# Patient Record
Sex: Female | Born: 1980 | Hispanic: Yes | Marital: Single | Smoking: Current some day smoker
Health system: Southern US, Community
[De-identification: ages and names within clinical notes are randomized; demographics above are authoritative.]

## PROBLEM LIST (undated history)

## (undated) ENCOUNTER — Inpatient Hospital Stay (HOSPITAL_COMMUNITY): Payer: Self-pay

## (undated) DIAGNOSIS — R51 Headache: Secondary | ICD-10-CM

## (undated) DIAGNOSIS — K219 Gastro-esophageal reflux disease without esophagitis: Secondary | ICD-10-CM

## (undated) DIAGNOSIS — L0390 Cellulitis, unspecified: Secondary | ICD-10-CM

## (undated) DIAGNOSIS — R519 Headache, unspecified: Secondary | ICD-10-CM

## (undated) HISTORY — PX: CHOLECYSTECTOMY: SHX55

---

## 2002-05-07 ENCOUNTER — Emergency Department (HOSPITAL_COMMUNITY): Admission: EM | Admit: 2002-05-07 | Discharge: 2002-05-07 | Payer: Self-pay | Admitting: Emergency Medicine

## 2002-05-07 ENCOUNTER — Encounter: Payer: Self-pay | Admitting: Emergency Medicine

## 2002-11-14 ENCOUNTER — Encounter (INDEPENDENT_AMBULATORY_CARE_PROVIDER_SITE_OTHER): Payer: Self-pay | Admitting: *Deleted

## 2002-11-14 ENCOUNTER — Encounter: Payer: Self-pay | Admitting: Obstetrics and Gynecology

## 2002-11-14 ENCOUNTER — Inpatient Hospital Stay (HOSPITAL_COMMUNITY): Admission: AD | Admit: 2002-11-14 | Discharge: 2002-11-14 | Payer: Self-pay | Admitting: Obstetrics and Gynecology

## 2004-03-16 ENCOUNTER — Inpatient Hospital Stay (HOSPITAL_COMMUNITY): Admission: AD | Admit: 2004-03-16 | Discharge: 2004-03-16 | Payer: Self-pay | Admitting: Obstetrics & Gynecology

## 2004-04-05 ENCOUNTER — Emergency Department (HOSPITAL_COMMUNITY): Admission: EM | Admit: 2004-04-05 | Discharge: 2004-04-05 | Payer: Self-pay | Admitting: Emergency Medicine

## 2004-05-15 ENCOUNTER — Inpatient Hospital Stay (HOSPITAL_COMMUNITY): Admission: AD | Admit: 2004-05-15 | Discharge: 2004-05-15 | Payer: Self-pay | Admitting: Obstetrics & Gynecology

## 2004-07-12 ENCOUNTER — Inpatient Hospital Stay (HOSPITAL_COMMUNITY): Admission: AD | Admit: 2004-07-12 | Discharge: 2004-07-12 | Payer: Self-pay | Admitting: Obstetrics

## 2004-08-14 ENCOUNTER — Inpatient Hospital Stay (HOSPITAL_COMMUNITY): Admission: AD | Admit: 2004-08-14 | Discharge: 2004-08-16 | Payer: Self-pay | Admitting: Obstetrics & Gynecology

## 2004-08-25 ENCOUNTER — Inpatient Hospital Stay (HOSPITAL_COMMUNITY): Admission: AD | Admit: 2004-08-25 | Discharge: 2004-08-25 | Payer: Self-pay | Admitting: Obstetrics & Gynecology

## 2004-08-31 ENCOUNTER — Inpatient Hospital Stay (HOSPITAL_COMMUNITY): Admission: AD | Admit: 2004-08-31 | Discharge: 2004-08-31 | Payer: Self-pay | Admitting: Obstetrics

## 2004-09-02 ENCOUNTER — Inpatient Hospital Stay (HOSPITAL_COMMUNITY): Admission: AD | Admit: 2004-09-02 | Discharge: 2004-09-04 | Payer: Self-pay | Admitting: Obstetrics

## 2004-09-02 ENCOUNTER — Inpatient Hospital Stay (HOSPITAL_COMMUNITY): Admission: AD | Admit: 2004-09-02 | Discharge: 2004-09-02 | Payer: Self-pay | Admitting: Obstetrics

## 2004-11-14 ENCOUNTER — Ambulatory Visit: Payer: Self-pay | Admitting: Gastroenterology

## 2004-12-27 ENCOUNTER — Encounter (INDEPENDENT_AMBULATORY_CARE_PROVIDER_SITE_OTHER): Payer: Self-pay | Admitting: Specialist

## 2004-12-27 ENCOUNTER — Ambulatory Visit (HOSPITAL_COMMUNITY): Admission: RE | Admit: 2004-12-27 | Discharge: 2004-12-28 | Payer: Self-pay | Admitting: General Surgery

## 2005-04-10 IMAGING — US US OB LIMITED
1 series · 14 of 28 positions shown · non-contrast
Comparison: none

CLINICAL DATA: Assess amniotic fluid volume.

[Series 1: us ob limited · 14 of 31 slices shown]
[im 2/31]
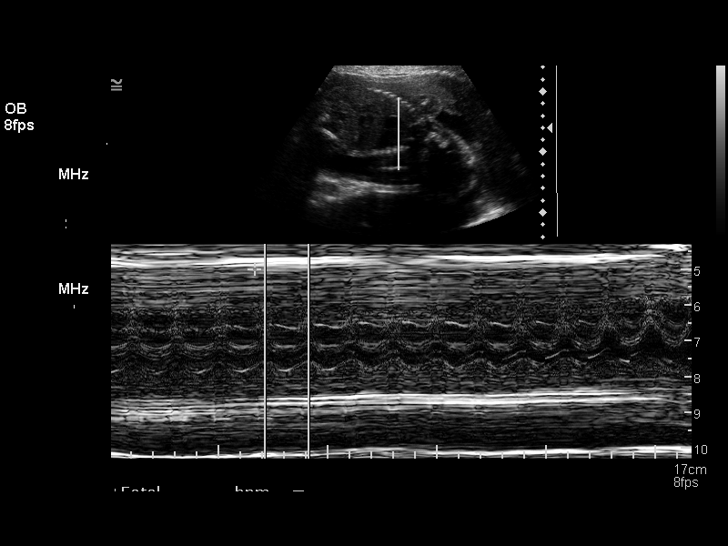
[im 4/31]
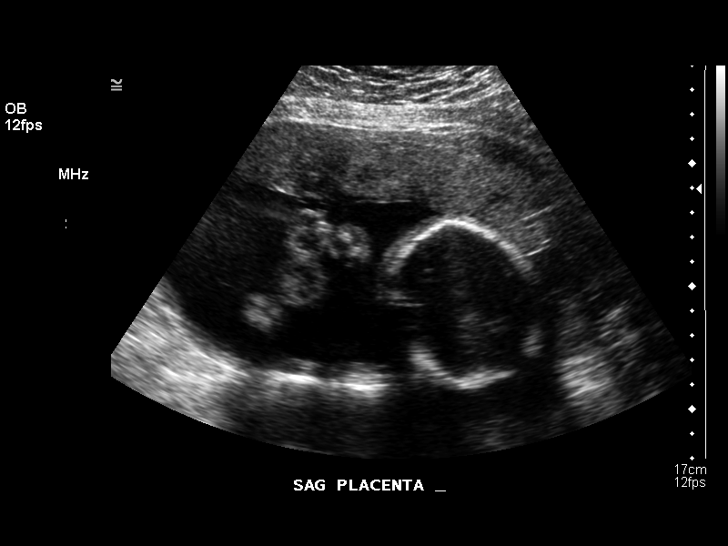
[im 6/31]
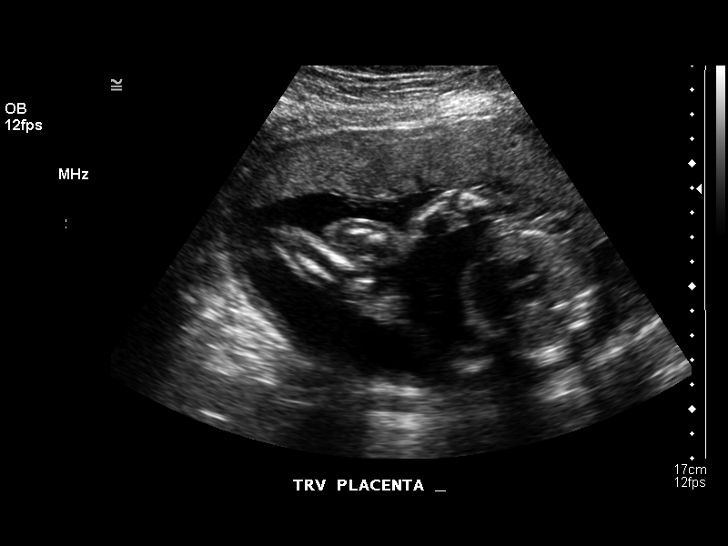
[im 8/31]
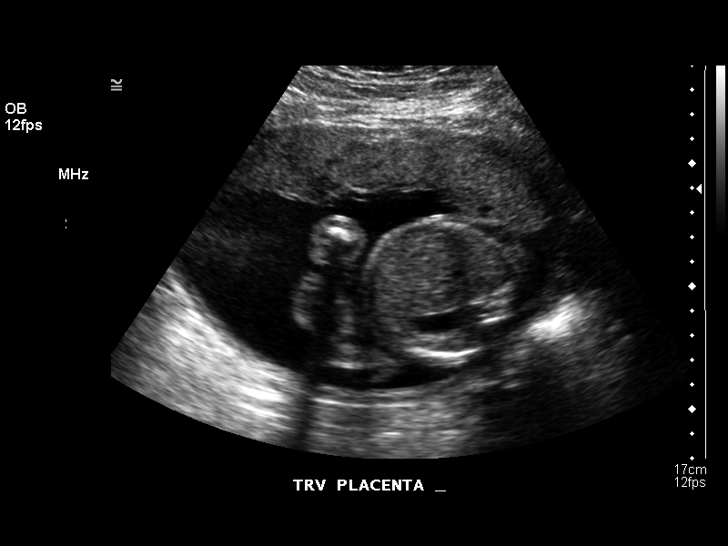
[im 11/31]
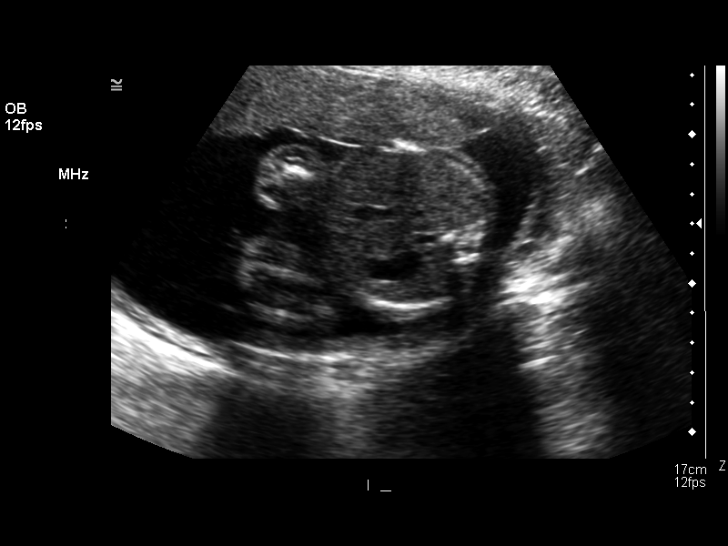
[im 13/31]
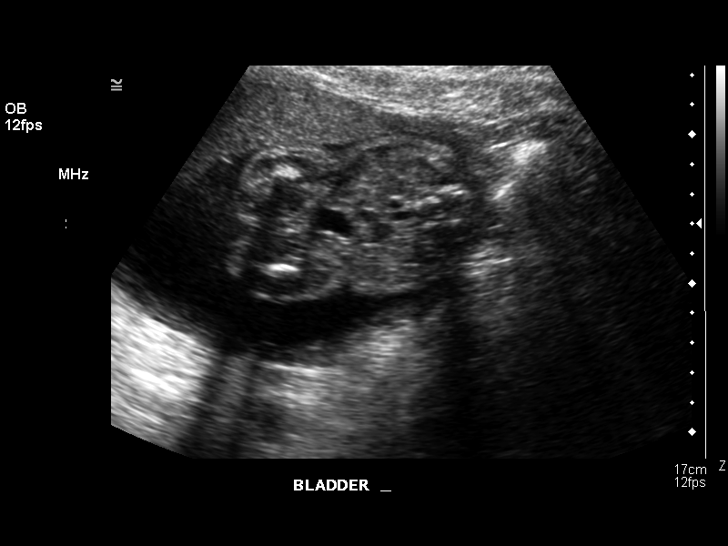
[im 15/31]
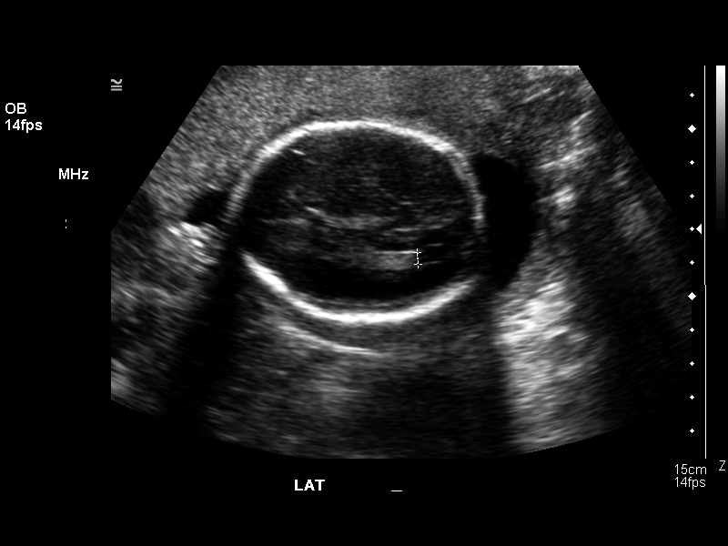
[im 17/31]
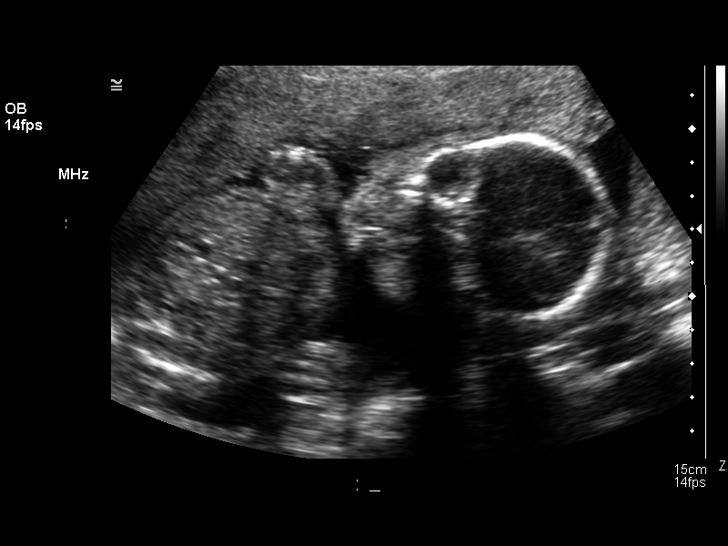
[im 19/31]
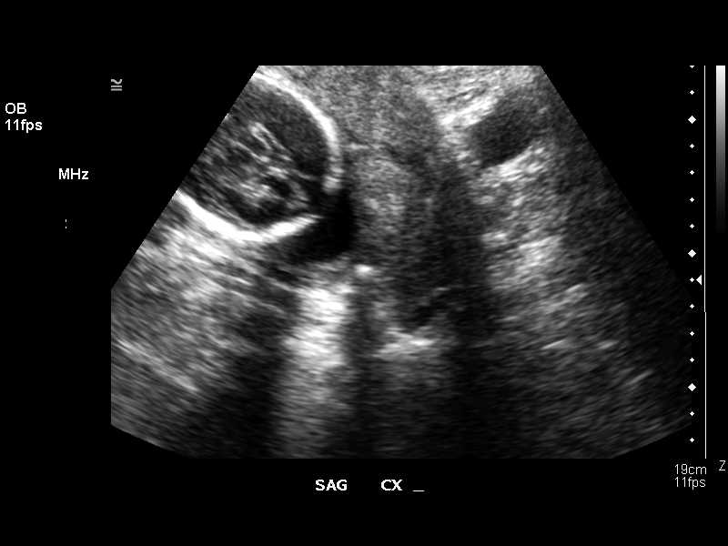
[im 22/31]
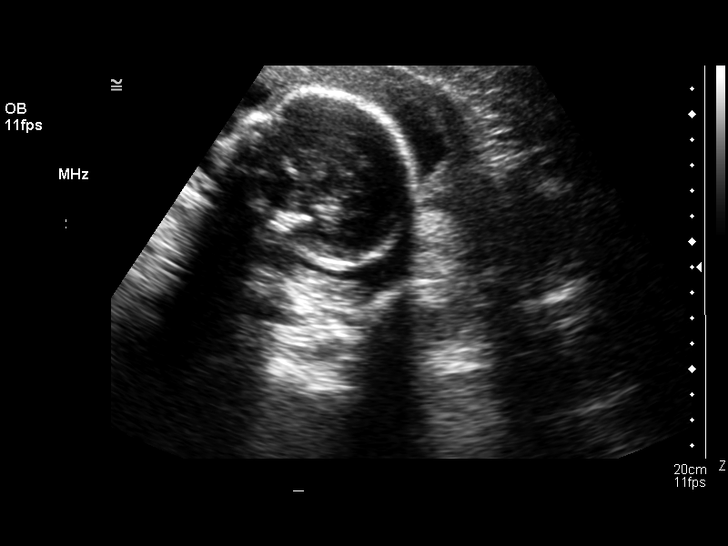
[im 24/31]
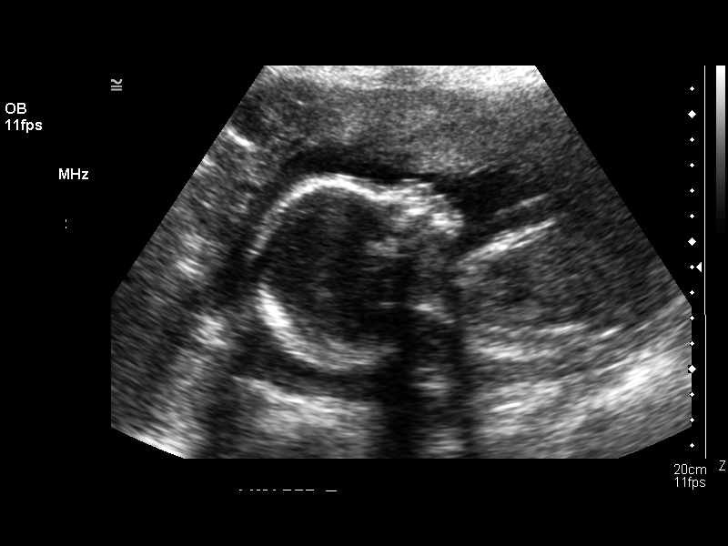
[im 26/31]
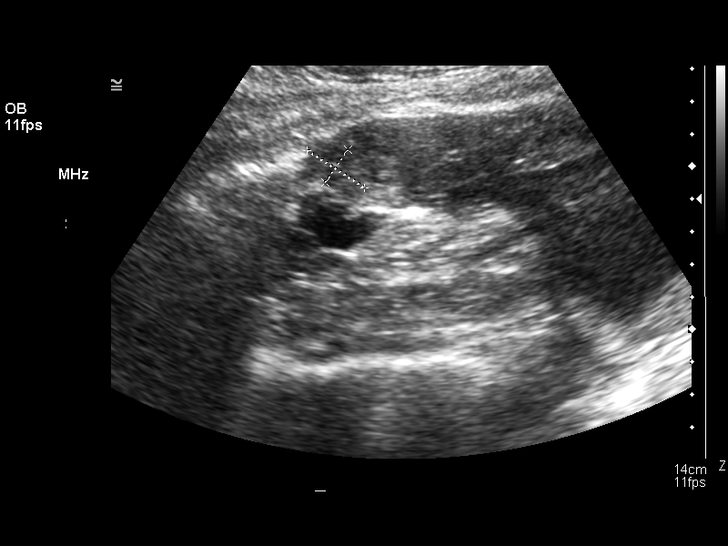
[im 28/31]
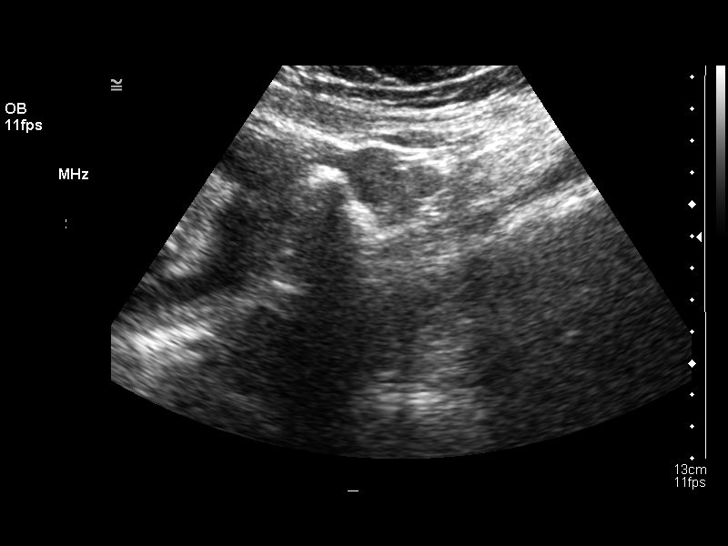
[im 31/31]
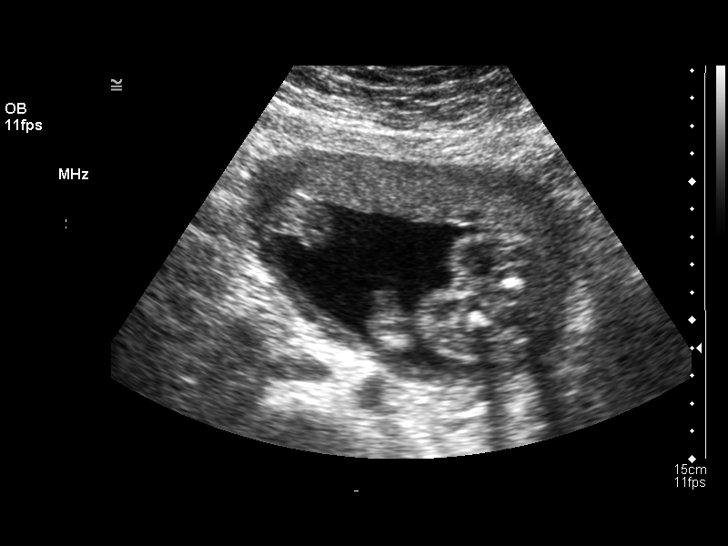

[14 of 28 positions shown; findings below may reference images not displayed]

LIMITED OBSTETRICAL ULTRASOUND:
Number of Fetuses: 1
Heart Rate:  150
Movement:  Yes
Breathing:  No
Presentation:  Cephalic
Placental Location:  Anterior
Grade:  I
Previa:  No
Amniotic Fluid (Subjective):  Normal
Amniotic Fluid (Objective):  5.4 cm Vertical pocket 

Fetal measurements and complete anatomic evaluation were not requested.  The following fetal anatomy was visualized during this exam:  Lateral ventricles, thalami, posterior fossa, stomach, 3-vessel cord, kidneys, bladder, profile, diaphragm and female genitalia.

MATERNAL UTERINE AND ADNEXAL FINDINGS
Cervix:  3.4 cm Transabdominally
IMPRESSION: 1.  Subjectively and quantitatively normal amniotic fluid volume and normal cervical length.
2.  A limited anatomic exam was performed as this requested as a limited study.  No late developing fetal anatomic abnormalities are identified associated with the lateral ventricles, bladder, kidneys or stomach.  A four chamber heart view could not be reassessed due to positioning on today?s exam.

## 2005-11-13 ENCOUNTER — Emergency Department (HOSPITAL_COMMUNITY): Admission: EM | Admit: 2005-11-13 | Discharge: 2005-11-13 | Payer: Self-pay | Admitting: Emergency Medicine

## 2006-05-11 ENCOUNTER — Emergency Department (HOSPITAL_COMMUNITY): Admission: EM | Admit: 2006-05-11 | Discharge: 2006-05-11 | Payer: Self-pay | Admitting: Emergency Medicine

## 2006-05-12 ENCOUNTER — Emergency Department (HOSPITAL_COMMUNITY): Admission: EM | Admit: 2006-05-12 | Discharge: 2006-05-12 | Payer: Self-pay | Admitting: Emergency Medicine

## 2006-05-14 ENCOUNTER — Emergency Department (HOSPITAL_COMMUNITY): Admission: EM | Admit: 2006-05-14 | Discharge: 2006-05-14 | Payer: Self-pay | Admitting: Emergency Medicine

## 2006-11-20 ENCOUNTER — Emergency Department (HOSPITAL_COMMUNITY): Admission: EM | Admit: 2006-11-20 | Discharge: 2006-11-20 | Payer: Self-pay | Admitting: Emergency Medicine

## 2007-01-23 ENCOUNTER — Inpatient Hospital Stay (HOSPITAL_COMMUNITY): Admission: AD | Admit: 2007-01-23 | Discharge: 2007-01-23 | Payer: Self-pay | Admitting: Obstetrics & Gynecology

## 2007-11-04 ENCOUNTER — Emergency Department (HOSPITAL_COMMUNITY): Admission: EM | Admit: 2007-11-04 | Discharge: 2007-11-04 | Payer: Self-pay | Admitting: Emergency Medicine

## 2007-12-29 ENCOUNTER — Emergency Department (HOSPITAL_COMMUNITY): Admission: EM | Admit: 2007-12-29 | Discharge: 2007-12-29 | Payer: Self-pay | Admitting: Emergency Medicine

## 2008-04-25 ENCOUNTER — Inpatient Hospital Stay (HOSPITAL_COMMUNITY): Admission: AD | Admit: 2008-04-25 | Discharge: 2008-04-25 | Payer: Self-pay | Admitting: Obstetrics & Gynecology

## 2009-01-23 ENCOUNTER — Emergency Department (HOSPITAL_COMMUNITY): Admission: EM | Admit: 2009-01-23 | Discharge: 2009-01-24 | Payer: Self-pay | Admitting: Emergency Medicine

## 2010-04-06 ENCOUNTER — Emergency Department (HOSPITAL_COMMUNITY)
Admission: EM | Admit: 2010-04-06 | Discharge: 2010-04-06 | Payer: Self-pay | Source: Home / Self Care | Admitting: Emergency Medicine

## 2010-04-08 ENCOUNTER — Emergency Department (HOSPITAL_COMMUNITY)
Admission: EM | Admit: 2010-04-08 | Discharge: 2010-04-08 | Payer: Self-pay | Source: Home / Self Care | Admitting: Emergency Medicine

## 2010-04-29 ENCOUNTER — Encounter: Payer: Self-pay | Admitting: Obstetrics and Gynecology

## 2010-06-19 LAB — URINE MICROSCOPIC-ADD ON

## 2010-06-19 LAB — BASIC METABOLIC PANEL
Calcium: 8.9 mg/dL (ref 8.4–10.5)
GFR calc Af Amer: >60 mL/min (ref >60)
GFR calc non Af Amer: >60 mL/min (ref >60)
Potassium: 3.4 mEq/L — ABNORMAL LOW (ref 3.5–5.1)
Sodium: 140 mEq/L (ref 135–145)

## 2010-06-19 LAB — GC/CHLAMYDIA PROBE AMP, GENITAL
Chlamydia, DNA Probe: NEGATIVE
GC Probe Amp, Genital: NEGATIVE

## 2010-06-19 LAB — WET PREP, GENITAL
Trich, Wet Prep: NONE SEEN
Yeast Wet Prep HPF POC: NONE SEEN

## 2010-06-19 LAB — CBC
Hemoglobin: 13.3 g/dL (ref 12.0–15.0)
MCH: 30.2 pg (ref 26.0–34.0)
MCHC: 33.7 g/dL (ref 30.0–36.0)
RBC: 4.41 MIL/uL (ref 3.87–5.11)
WBC: 9.6 10*3/uL (ref 4.0–10.5)

## 2010-06-19 LAB — URINE CULTURE

## 2010-06-19 LAB — URINALYSIS, ROUTINE W REFLEX MICROSCOPIC
Nitrite: NEGATIVE
Specific Gravity, Urine: 1.028 (ref 1.005–1.030)
pH: 7.5 (ref 5.0–8.0)

## 2010-07-13 LAB — CBC
HCT: 40.8 % (ref 36.0–46.0)
Hemoglobin: 13.8 g/dL (ref 12.0–15.0)
MCHC: 33.9 g/dL (ref 30.0–36.0)
MCV: 88.4 fL (ref 78.0–100.0)
Platelets: 178 10*3/uL (ref 150–400)
RBC: 4.61 MIL/uL (ref 3.87–5.11)
WBC: 11.9 10*3/uL — ABNORMAL HIGH (ref 4.0–10.5)

## 2010-07-13 LAB — POCT I-STAT, CHEM 8
BUN: 10 mg/dL (ref 6–23)
Calcium, Ion: 1.18 mmol/L (ref 1.12–1.32)
Creatinine, Ser: 0.8 mg/dL (ref 0.4–1.2)
Glucose, Bld: 93 mg/dL (ref 70–99)
HCT: 44 % (ref 36.0–46.0)
TCO2: 27 mmol/L (ref 0–100)

## 2010-07-13 LAB — DIFFERENTIAL
Basophils Absolute: 0.1 10*3/uL (ref 0.0–0.1)
Eosinophils Relative: 5 % (ref 0–5)
Lymphocytes Relative: 44 % (ref 12–46)
Monocytes Relative: 4 % (ref 3–12)
Neutrophils Relative %: 47 % (ref 43–77)

## 2010-08-25 NOTE — Op Note (Signed)
Brandy Beasley, Brandy Beasley               ACCOUNT NO.:  [id number]   MEDICAL RECORD NO.:  [id number]          PATIENT TYPE:  AMB   LOCATION:  SDS                          FACILITY:  [facility]   PHYSICIAN:  Gita Kudo, M.D. DATE OF BIRTH:  1980/11/29   DATE OF PROCEDURE:  12/27/2004  DATE OF DISCHARGE:                                 OPERATIVE REPORT   OPERATIVE PROCEDURE:  Laparoscopic cholecystectomy.   SURGEON:  Gita Kudo, M.D.   ASSISTANT:  Lebron Conners, M.D.   ANESTHESIA:  General endotracheal.   PREOPERATIVE DIAGNOSIS:  Gallstones, normal liver function studies.   POSTOPERATIVE DIAGNOSIS:  Gallstones, normal liver function studies.   CLINICAL SUMMARY:  [age]-year-old [employer] student with abdominal pain,  gallbladder ultrasound showing stones, normal liver function studies and a  strong family history of gallbladder disease.  Brought in for elective  cholecystectomy.   OPERATIVE FINDINGS:  The gallbladder was thin-walled, had multiple tiny  stones in it.  The cystic duct was quite small and I could not cannulate it  for cholangiogram.  The anatomy was quite normal of both the cystic duct and  artery.   OPERATIVE PROCEDURE:  Under satisfactory general endotracheal anesthesia,  the patient's abdomen was prepped and draped in a standard fashion.  She  received 1 g Ancef preop and a total of 30 mL of 0.5% Marcaine with  epinephrine were infiltrated at the skin incision sites for postop  analgesia.  A vertical incision made below the umbilicus and carried down to  the midline, which was opened into the peritoneum.  Controlled with a figure-  of-eight 0 Vicryl suture and operating Hasson port inserted and secured.  Good CO2 pneumoperitoneum established and then two #5 ports placed laterally  and a second #10 port medially.  This was done after the camera was placed  under direct visualization.  The lateral ports gave excellent exposure with  graspers and then operating  through the medial port, I carefully dissected  the cystic duct-gallbladder junction and circumferentially dissected the  cystic duct and artery and when the structures were securely identified,  multiple clips placed on the cystic artery and a single clip on the duct  near the gallbladder.  Incision made in the duct and cholangiogram catheter  inserted percutaneously, but I was unable to get it into the duct, which was  quite small.  We were sure of the anatomy and therefore the catheter  withdrawn and the duct controlled with multiple clips distally and then both  it and the artery divided.  The gallbladder was then dissected from below  upward using coagulating current for dissection and hemostasis.  A small  posterior branch of the artery was identified and controlled with clips and  then cauterized.  In dissecting the gallbladder, a small hole was made in it  with a leakage of a little clear bile, but no stone.  The dissection was  continued up and the gallbladder amputated from the bed.  The bed was  checked for hemostasis by cautery, lavaged with saline and suctioned dry.  Then the gallbladder placed in  an EndoCatch bag and the camera moved to the  upper port and through the lower port, the gallbladder with the bag removed  without spillage or complication.  A few small stones were felt in the  gallbladder and some given to the patient for her collection.   Following this, the operative site was again checked, lavaged with saline  and suctioned dry.  Hemostasis was good, no spillage, the ports removed and  CO2 released.  Then the midline was closed with a previous figure-of-eight  and a second interrupted 0 Vicryl and the subcu approximated with 4-0 Vicryl  and skin edges with Steri-Strips.  Sterile absorbent dressings were then  applied and the patient went to the recovery room from the operating room  good condition.           ______________________________  Gita Kudo,  M.D.     MRL/MEDQ  D:  12/27/2004  T:  12/27/2004  Job:  [id number]   cc:   [name], M.D.   Vania Rea. Jarold Motto, M.D. [facility]  [address]  [place]  [place] [address]

## 2011-01-08 LAB — COMPREHENSIVE METABOLIC PANEL
AST: 27
Albumin: 3.8
BUN: 8
Chloride: 105
Glucose, Bld: 100 — ABNORMAL HIGH
Potassium: 4
Sodium: 134 — ABNORMAL LOW
Total Bilirubin: 1.2
Total Protein: 7.2

## 2011-01-08 LAB — URINE MICROSCOPIC-ADD ON

## 2011-01-08 LAB — DIFFERENTIAL
Eosinophils Absolute: 0.3
Eosinophils Relative: 3
Lymphocytes Relative: 31
Monocytes Relative: 6

## 2011-01-08 LAB — CBC
Hemoglobin: 14.6
MCV: 87.1
Platelets: 170
WBC: 9.8

## 2011-01-08 LAB — URINALYSIS, ROUTINE W REFLEX MICROSCOPIC
Bilirubin Urine: NEGATIVE
Protein, ur: NEGATIVE
Specific Gravity, Urine: 1.035 — ABNORMAL HIGH
Urobilinogen, UA: 1
pH: 6

## 2011-01-08 LAB — PREGNANCY, URINE: Preg Test, Ur: NEGATIVE

## 2011-01-17 LAB — URINALYSIS, ROUTINE W REFLEX MICROSCOPIC
Glucose, UA: NEGATIVE
pH: 6

## 2011-01-17 LAB — WET PREP, GENITAL
Clue Cells Wet Prep HPF POC: NONE SEEN
Trich, Wet Prep: NONE SEEN
Yeast Wet Prep HPF POC: NONE SEEN

## 2011-01-22 LAB — URINE CULTURE

## 2011-01-22 LAB — ABO/RH: ABO/RH(D): A POS

## 2011-01-22 LAB — DIFFERENTIAL
Basophils Relative: 1
Eosinophils Absolute: 0.2
Monocytes Relative: 6
Neutrophils Relative %: 51

## 2011-01-22 LAB — POCT PREGNANCY, URINE
Operator id: 29017
Preg Test, Ur: NEGATIVE

## 2011-01-22 LAB — URINALYSIS, ROUTINE W REFLEX MICROSCOPIC
Glucose, UA: NEGATIVE
Nitrite: NEGATIVE
Protein, ur: NEGATIVE
Urobilinogen, UA: 0.2

## 2011-01-22 LAB — CBC
MCHC: 34.1
MCV: 83.5
Platelets: 187

## 2011-01-22 LAB — WET PREP, GENITAL: Yeast Wet Prep HPF POC: NONE SEEN

## 2011-01-22 LAB — URINE MICROSCOPIC-ADD ON

## 2011-01-22 LAB — GC/CHLAMYDIA PROBE AMP, GENITAL: GC Probe Amp, Genital: NEGATIVE

## 2011-04-10 ENCOUNTER — Encounter: Payer: Self-pay | Admitting: *Deleted

## 2011-04-10 ENCOUNTER — Emergency Department (HOSPITAL_COMMUNITY)
Admission: EM | Admit: 2011-04-10 | Discharge: 2011-04-11 | Disposition: A | Discharge status: Home / Self Care | Payer: Medicaid Other | Attending: Emergency Medicine | Admitting: Emergency Medicine

## 2011-04-10 DIAGNOSIS — R059 Cough, unspecified: Secondary | ICD-10-CM | POA: Insufficient documentation

## 2011-04-10 DIAGNOSIS — R062 Wheezing: Secondary | ICD-10-CM | POA: Insufficient documentation

## 2011-04-10 DIAGNOSIS — J40 Bronchitis, not specified as acute or chronic: Secondary | ICD-10-CM | POA: Insufficient documentation

## 2011-04-10 DIAGNOSIS — F17200 Nicotine dependence, unspecified, uncomplicated: Secondary | ICD-10-CM | POA: Insufficient documentation

## 2011-04-10 DIAGNOSIS — R05 Cough: Secondary | ICD-10-CM | POA: Insufficient documentation

## 2011-04-10 MED ORDER — IPRATROPIUM BROMIDE 0.02 % IN SOLN
0.5000 mg | RESPIRATORY_TRACT | Status: AC
  Administered 2011-04-11: 0.5 mg via RESPIRATORY_TRACT
  Filled 2011-04-10: qty 2.5

## 2011-04-10 MED ORDER — ALBUTEROL SULFATE (5 MG/ML) 0.5% IN NEBU
5.0000 mg | INHALATION_SOLUTION | Freq: Once | RESPIRATORY_TRACT | Status: AC
  Administered 2011-04-11: 5 mg via RESPIRATORY_TRACT
  Filled 2011-04-10: qty 1

## 2011-04-10 NOTE — ED Notes (Signed)
Pt in c/o cough and congestion, headache and body aches, sore throat x3 weeks, worse last night

## 2011-04-11 ENCOUNTER — Emergency Department (HOSPITAL_COMMUNITY): Payer: Medicaid Other

## 2011-04-11 MED ORDER — LIDOCAINE-EPINEPHRINE (PF) 2 %-1:200000 IJ SOLN: 20.0000 mL | Freq: Once | INTRAMUSCULAR | Status: DC

## 2011-04-11 MED ORDER — AZITHROMYCIN 250 MG PO TABS: 250.0000 mg | ORAL_TABLET | Freq: Every day | ORAL | Status: AC

## 2011-04-11 MED ORDER — HYDROCOD POLST-CHLORPHEN POLST 10-8 MG/5ML PO LQCR: 5.0000 mL | Freq: Two times a day (BID) | ORAL | Status: DC | PRN

## 2011-04-11 MED ORDER — ALBUTEROL SULFATE HFA 108 (90 BASE) MCG/ACT IN AERS: 2.0000 | INHALATION_SPRAY | RESPIRATORY_TRACT | Status: DC | PRN

## 2011-04-11 NOTE — ED Provider Notes (Signed)
History     CSN: [id number]  Arrival date & time 04/10/11  2304   First MD Initiated Contact with Patient 04/10/11 2333      Chief Complaint  Patient presents with  . URI   HPI Patient presents to the emergency room with complaint of cough and congestion for three weeks. Reports that she smokes. Productive cough. No other concerns. Fevers two weeks ago. No nausea, vomiting, abdominal pain or chest pain.   History reviewed. No pertinent past medical history.  History reviewed. No pertinent past surgical history.  History reviewed. No pertinent family history.  History  Substance Use Topics  . Smoking status: Current Everyday Smoker  . Smokeless tobacco: Not on file  . Alcohol Use: No    OB History    Grav Para Term Preterm Abortions TAB SAB Ect Mult Living                  Review of Systems  Constitutional: Positive for fever. Negative for chills, diaphoresis and appetite change.  HENT: Positive for congestion. Negative for drooling, trouble swallowing, neck pain, neck stiffness and voice change.   Eyes: Negative for photophobia and visual disturbance.  Respiratory: Positive for cough. Negative for chest tightness, shortness of breath, wheezing and stridor.   Cardiovascular: Negative for chest pain.  Gastrointestinal: Negative for nausea, vomiting and abdominal pain.  Genitourinary: Negative for flank pain.  Musculoskeletal: Negative for back pain.  Skin: Negative for rash.  Neurological: Negative for weakness and numbness.  All other systems reviewed and are negative.    Allergies  Review of patient's allergies indicates no known allergies.  Home Medications   Current Outpatient Rx  Name Route Sig Dispense Refill  . GUAIFENESIN ER 600 MG PO TB12 Oral Take 1,200 mg by mouth 2 (two) times daily.      Marland Kitchen PSEUDOEPHEDRINE HCL ER 120 MG PO TB12 Oral Take 120 mg by mouth every 12 (twelve) hours.        BP 130/84  Pulse 112  Temp(Src) 98.4 F (36.9 C) (Oral)   Resp 20  SpO2 100%  Physical Exam  Nursing note and vitals reviewed. Constitutional: She is oriented to person, place, and time. She appears well-developed and well-nourished.  Non-toxic appearance. No distress.       Vital signs stable  HENT:  Head: Normocephalic and atraumatic.  Right Ear: External ear normal.  Left Ear: External ear normal.  Nose: Nose normal.  Mouth/Throat: Oropharynx is clear and moist. No oropharyngeal exudate.  Eyes: EOM are normal. Pupils are equal, round, and reactive to light. Right eye exhibits no discharge. Left eye exhibits no discharge. No scleral icterus.  Neck: Normal range of motion. Neck supple.  Cardiovascular: Normal rate, regular rhythm, normal heart sounds and intact distal pulses.  Exam reveals no gallop and no friction rub.   No murmur heard. Pulmonary/Chest: Effort normal and breath sounds normal. No respiratory distress. She has no wheezes. She exhibits no tenderness.  Abdominal: Soft. Bowel sounds are normal. There is no tenderness. There is no rebound and no guarding.  Musculoskeletal: Normal range of motion. She exhibits no edema and no tenderness.  Lymphadenopathy:    She has no cervical adenopathy.  Neurological: She is alert and oriented to person, place, and time. She exhibits normal muscle tone.  Skin: Skin is warm and dry. No rash noted. She is not diaphoretic.  Psychiatric: She has a normal mood and affect. Her behavior is normal. Judgment and thought content normal.  ED Course  Procedures (including critical care time)  Patient seen and evaluated.  VSS reviewed. . Nursing notes reviewed.  Initial testing ordered. Will monitor the patient closely. They agree with the treatment plan and diagnosis.    Labs Reviewed - No data to display Dg Chest 2 View  04/11/2011  *RADIOLOGY REPORT*  Clinical Data: Fever.  Cough.  Wheezing.  CHEST - 2 VIEW  Comparison: 11/13/2005  Findings: There is poorly defined nodularity in the lingula which  was not present on the prior exam from 2007.  Mild airway thickening suggest bronchitis or reactive airways disease.  Heart size is within normal limits.  No pleural effusion is observed.  IMPRESSION:  1.  Airway thickening suggesting bronchitis or reactive airways disease. 2.  Mild nodularity in the lingula which was not present in 2007. This could be from atelectasis or confluent inflammatory process in the lingula.  Follow-up chest radiography in 3-4 week's time may be warranted to ensure resolution and exclude the unlikely possibility of a neoplastic pulmonary nodule.  Original Report Authenticated By: Dellia Cloud, M.D.   Patient seen and re-evaluated. Resting comfortably. VSS stable. NAD. Patient notified of testing results. Stated agreement and understanding. Patient stated understanding to treatment plan and diagnosis.    No diagnosis found.    MDM  Bronchitis, VS improved, HR 103. 100% on RA. No chest pain. No inspiration pain. No SOB. Will treat with antibiotics as patient is a smoker. Prescribe inhaler as well. Notified patient of testing results and importance of repeat chest imaging in 3 weeks. Stated agreement and understanding.         Demetrius Charity, PA 04/11/11 [phone]

## 2011-04-11 NOTE — ED Provider Notes (Signed)
Medical screening examination/treatment/procedure(s) were performed by non-physician practitioner and as supervising physician I was immediately available for consultation/collaboration.   Suzi Roots, MD 04/11/11 [phone]

## 2012-05-06 ENCOUNTER — Emergency Department (HOSPITAL_COMMUNITY)
Admission: EM | Admit: 2012-05-06 | Discharge: 2012-05-06 | Disposition: A | Discharge status: Home / Self Care | Payer: Medicaid Other | Attending: Emergency Medicine | Admitting: Emergency Medicine

## 2012-05-06 ENCOUNTER — Emergency Department (HOSPITAL_COMMUNITY): Payer: Medicaid Other

## 2012-05-06 ENCOUNTER — Encounter (HOSPITAL_COMMUNITY): Payer: Self-pay | Admitting: Emergency Medicine

## 2012-05-06 DIAGNOSIS — IMO0002 Reserved for concepts with insufficient information to code with codable children: Secondary | ICD-10-CM | POA: Insufficient documentation

## 2012-05-06 DIAGNOSIS — F17200 Nicotine dependence, unspecified, uncomplicated: Secondary | ICD-10-CM | POA: Insufficient documentation

## 2012-05-06 DIAGNOSIS — Y92009 Unspecified place in unspecified non-institutional (private) residence as the place of occurrence of the external cause: Secondary | ICD-10-CM | POA: Insufficient documentation

## 2012-05-06 DIAGNOSIS — W208XXA Other cause of strike by thrown, projected or falling object, initial encounter: Secondary | ICD-10-CM | POA: Insufficient documentation

## 2012-05-06 DIAGNOSIS — T148XXA Other injury of unspecified body region, initial encounter: Secondary | ICD-10-CM | POA: Insufficient documentation

## 2012-05-06 DIAGNOSIS — Y9389 Activity, other specified: Secondary | ICD-10-CM | POA: Insufficient documentation

## 2012-05-06 DIAGNOSIS — M549 Dorsalgia, unspecified: Secondary | ICD-10-CM

## 2012-05-06 MED ORDER — CYCLOBENZAPRINE HCL 10 MG PO TABS: 10.0000 mg | ORAL_TABLET | Freq: Two times a day (BID) | ORAL | Status: DC | PRN

## 2012-05-06 MED ORDER — HYDROCODONE-ACETAMINOPHEN 5-325 MG PO TABS: 1.0000 | ORAL_TABLET | ORAL | Status: DC | PRN

## 2012-05-06 MED ORDER — OXYCODONE-ACETAMINOPHEN 5-325 MG PO TABS
2.0000 | ORAL_TABLET | Freq: Once | ORAL | Status: AC
  Administered 2012-05-06: 2 via ORAL
  Filled 2012-05-06: qty 2

## 2012-05-06 NOTE — ED Notes (Signed)
Pt states about a month ago she was putting clothes in her dresser and the TV fell off onto her back  Pt states since then she has been having pain from her midback down  Pt describes it as a bone on bone, grinding pain

## 2012-05-06 NOTE — ED Notes (Signed)
Pt has a ride home.  

## 2012-05-06 NOTE — ED Provider Notes (Signed)
Medical screening examination/treatment/procedure(s) were performed by non-physician practitioner and as supervising physician I was immediately available for consultation/collaboration.  Jeanie Mccard R. Jet Armbrust, MD 05/06/12 2242 

## 2012-05-06 NOTE — ED Provider Notes (Signed)
History     CSN: [id number]  Arrival date & time 05/06/12  2100   First MD Initiated Contact with Patient 05/06/12 2110      Chief Complaint  Patient presents with  . Back Pain    (Consider location/radiation/quality/duration/timing/severity/associated sxs/prior treatment) HPI Comments: 32 y/o female with no significant past medical history presents to the ED complaining of back pain x 1 month after a TV fell off her son's dresser onto her back. Since then she has back pain from middle of her back down to lower back. Pain described as "her bones grinding" rated 7/10. Yesterday the pain worsened to 10/10. She tried taking motrin without relief. Walking and moving make the pain worse. Admits to occasional numbness down both of her legs. No loss of control of bowels or bladder or saddle anesthesia. Denies nausea or vomiting. There was never any bruising present, however her friend states her back appeared "swollen" initially.   The history is provided by the patient.    History reviewed. No pertinent past medical history.  Past Surgical History  Procedure Date  . Cholecystectomy     Family History  Problem Relation Age of Onset  . Hypertension Other   . Diabetes Other   . Cancer Other     History  Substance Use Topics  . Smoking status: Current Every Day Smoker    Types: Cigarettes  . Smokeless tobacco: Not on file  . Alcohol Use: No    OB History    Grav Para Term Preterm Abortions TAB SAB Ect Mult Living                  Review of Systems  HENT: Negative for neck pain.   Gastrointestinal: Negative for nausea and vomiting.  Musculoskeletal: Positive for back pain.  Skin: Negative for color change.  Neurological: Positive for numbness. Negative for weakness.  All other systems reviewed and are negative.    Allergies  Review of patient's allergies indicates no known allergies.  Home Medications  No current outpatient prescriptions on file.  BP 121/68   Pulse 93  Temp 98.1 F (36.7 C) (Oral)  Resp 20  SpO2 100%  LMP 04/27/2012  Physical Exam  Nursing note and vitals reviewed. Constitutional: She is oriented to person, place, and time. She appears well-developed. No distress.       Obese  HENT:  Head: Normocephalic and atraumatic.  Eyes: Conjunctivae normal and EOM are normal.  Neck: Normal range of motion. Neck supple.  Cardiovascular: Normal rate, regular rhythm, normal heart sounds and intact distal pulses.   Pulmonary/Chest: Effort normal and breath sounds normal. No respiratory distress.  Abdominal: Soft. Bowel sounds are normal. There is no tenderness.  Musculoskeletal:       Thoracic back: Normal. She exhibits no tenderness and no bony tenderness.       Lumbar back: She exhibits decreased range of motion, tenderness and bony tenderness. She exhibits no swelling, no edema and normal pulse.       Slow to go from sitting to standing.  Neurological: She is alert and oriented to person, place, and time. She has normal strength. No sensory deficit. Gait normal.  Skin: Skin is warm and dry.  Psychiatric: She has a normal mood and affect. Her behavior is normal.    ED Course  Procedures (including critical care time)  Labs Reviewed - No data to display Dg Lumbar Spine Complete  05/06/2012  *RADIOLOGY REPORT*  Clinical Data: Heavy object fell on  back about 1 month ago. Continued pain  LUMBAR SPINE - COMPLETE 4+ VIEW  Comparison:  None.  Findings:  There is no evidence of lumbar spine fracture. Alignment is normal.  Intervertebral disc spaces are maintained except for mild narrowing L5-S1. Prior cholecystectomy.  IMPRESSION: No acute abnormalities.  Mild disc space narrowing L5- S1.   Original Report Authenticated By: Davonna Belling, M.D.      1. Bone bruise   2. Back pain       MDM  32 y/o female with back pain after TV falling onto her back. Xray without any acute abnormality. Some relief with percocet. No red flags concerning  patient's back pain. No s/s of central cord compression or cauda equina. Lower extremities are neurovascularly intact and patient is ambulating slowly without difficulty. Advised rest and ice. Rx vicodin and flexeril. Resources given for PCP follow up. Return precautions discussed. Patient states understanding of plan and is agreeable.         Trevor Mace, PA-C 05/06/12 2159

## 2012-05-06 NOTE — ED Notes (Signed)
Pt ambulatory to room with steady gait. Pt arrives with family.

## 2012-09-09 ENCOUNTER — Emergency Department (HOSPITAL_COMMUNITY)
Admission: EM | Admit: 2012-09-09 | Discharge: 2012-09-09 | Disposition: A | Discharge status: Home / Self Care | Payer: Medicaid Other | Attending: Emergency Medicine | Admitting: Emergency Medicine

## 2012-09-09 ENCOUNTER — Encounter (HOSPITAL_COMMUNITY): Payer: Self-pay | Admitting: Emergency Medicine

## 2012-09-09 DIAGNOSIS — Z3202 Encounter for pregnancy test, result negative: Secondary | ICD-10-CM | POA: Insufficient documentation

## 2012-09-09 DIAGNOSIS — M545 Low back pain, unspecified: Secondary | ICD-10-CM | POA: Insufficient documentation

## 2012-09-09 DIAGNOSIS — F17200 Nicotine dependence, unspecified, uncomplicated: Secondary | ICD-10-CM | POA: Insufficient documentation

## 2012-09-09 DIAGNOSIS — M549 Dorsalgia, unspecified: Secondary | ICD-10-CM

## 2012-09-09 LAB — URINE MICROSCOPIC-ADD ON

## 2012-09-09 LAB — URINALYSIS, ROUTINE W REFLEX MICROSCOPIC
Glucose, UA: NEGATIVE mg/dL
Leukocytes, UA: NEGATIVE
Protein, ur: NEGATIVE mg/dL
Specific Gravity, Urine: 1.013 (ref 1.005–1.030)
pH: 7 (ref 5.0–8.0)

## 2012-09-09 LAB — GLUCOSE, CAPILLARY: Glucose-Capillary: 80 mg/dL (ref 70–99)

## 2012-09-09 LAB — POCT PREGNANCY, URINE: Preg Test, Ur: NEGATIVE

## 2012-09-09 MED ORDER — HYDROCODONE-ACETAMINOPHEN 5-325 MG PO TABS: 1.0000 | ORAL_TABLET | Freq: Three times a day (TID) | ORAL | Status: DC | PRN

## 2012-09-09 MED ORDER — DIAZEPAM 5 MG PO TABS
5.0000 mg | ORAL_TABLET | Freq: Once | ORAL | Status: AC
  Administered 2012-09-09: 5 mg via ORAL
  Filled 2012-09-09: qty 1

## 2012-09-09 MED ORDER — HYDROCODONE-ACETAMINOPHEN 5-325 MG PO TABS
1.0000 | ORAL_TABLET | Freq: Once | ORAL | Status: AC
  Administered 2012-09-09: 1 via ORAL
  Filled 2012-09-09: qty 1

## 2012-09-09 MED ORDER — DIAZEPAM 5 MG PO TABS: 5.0000 mg | ORAL_TABLET | Freq: Two times a day (BID) | ORAL | Status: DC

## 2012-09-09 MED ORDER — IBUPROFEN 800 MG PO TABS: 800.0000 mg | ORAL_TABLET | Freq: Three times a day (TID) | ORAL | Status: AC

## 2012-09-09 MED ORDER — IBUPROFEN 800 MG PO TABS
800.0000 mg | ORAL_TABLET | Freq: Once | ORAL | Status: AC
  Administered 2012-09-09: 800 mg via ORAL
  Filled 2012-09-09: qty 1

## 2012-09-09 NOTE — ED Provider Notes (Signed)
History     CSN: [id number]  Arrival date & time 09/09/12  0941   First MD Initiated Contact with Patient 09/09/12 1118      Chief Complaint  Patient presents with  . Back Pain    (Consider location/radiation/quality/duration/timing/severity/associated sxs/prior treatment) HPI Patient presents with back pain.  She notes a history of intermittent pain, but this episode has been persistent, worsening for the past 2 days.  No clear precipitant.  Since onset has been severe, focally about the lower back with radiation down both legs posteriorly.  There is no new dysuria, incontinence, fever, distal dysesthesia, abdominal pain, vomiting. Noticeably for a medication as far. She notes that there was mild nausea and lightheadedness previously. None currently. No surgical history, and the patient is in generally good health. History reviewed. No pertinent past medical history.  Past Surgical History  Procedure Laterality Date  . Cholecystectomy      Family History  Problem Relation Age of Onset  . Hypertension Other   . Diabetes Other   . Cancer Other     History  Substance Use Topics  . Smoking status: Current Every Day Smoker    Types: Cigarettes  . Smokeless tobacco: Not on file  . Alcohol Use: No    OB History   Grav Para Term Preterm Abortions TAB SAB Ect Mult Living                  Review of Systems  All other systems reviewed and are negative.    Allergies  Review of patient's allergies indicates no known allergies.  Home Medications  No current outpatient prescriptions on file.  BP 109/66  Pulse 75  Temp(Src) 98.2 F (36.8 C) (Oral)  Resp 16  SpO2 99%  LMP 08/26/2012  Physical Exam  Nursing note and vitals reviewed. Constitutional: She is oriented to person, place, and time. She appears well-developed and well-nourished. No distress.  HENT:  Head: Normocephalic and atraumatic.  Eyes: Conjunctivae and EOM are normal.  Pulmonary/Chest: Effort  normal. No stridor. No respiratory distress.  Abdominal: She exhibits no distension.  Musculoskeletal: She exhibits no edema.       Arms: Bilateral positive straight leg test, more severe on the left.  Neurological: She is alert and oriented to person, place, and time. No cranial nerve deficit.  Skin: Skin is warm and dry.  Psychiatric: She has a normal mood and affect.    ED Course  Procedures (including critical care time)  Labs Reviewed  URINALYSIS, ROUTINE W REFLEX MICROSCOPIC   No results found.   No diagnosis found.    MDM  This generally well-appearing young female presents with 2 days of severe low back pain.  The absence of any risk factors, fever, neurologic deficiency is reassuring for the low suspicion of acute neurologic compromise.  The patient was started on medication, and is on the need for outpatient followup, management, and discharged in stable condition.      Gerhard Munch, MD 09/09/12 [phone]

## 2012-09-09 NOTE — ED Notes (Addendum)
Pt states that she has been having low back pain x 2 days.  Denies NVD.  Denies urinary sx.  Denies injury.  States that she is not dizzy now but was dizzy last night.

## 2012-09-09 NOTE — ED Notes (Signed)
Pt unable to sign, signature pad not working. Amber, NS aware and will call IT,

## 2012-10-01 ENCOUNTER — Ambulatory Visit: Payer: Self-pay | Admitting: Obstetrics

## 2012-11-17 ENCOUNTER — Ambulatory Visit: Payer: Self-pay | Admitting: Obstetrics

## 2012-12-22 ENCOUNTER — Ambulatory Visit: Payer: Medicaid Other | Admitting: Obstetrics

## 2012-12-23 ENCOUNTER — Ambulatory Visit: Payer: Medicaid Other | Admitting: Obstetrics

## 2013-01-12 ENCOUNTER — Ambulatory Visit (INDEPENDENT_AMBULATORY_CARE_PROVIDER_SITE_OTHER): Payer: Medicaid Other | Admitting: Obstetrics

## 2013-01-12 ENCOUNTER — Encounter: Payer: Self-pay | Admitting: Obstetrics

## 2013-01-12 VITALS — BP 113/74 | HR 88 | Temp 98.9°F | Ht 65.0 in | Wt 226.0 lb

## 2013-01-12 DIAGNOSIS — Z3046 Encounter for surveillance of implantable subdermal contraceptive: Secondary | ICD-10-CM

## 2013-01-12 NOTE — Progress Notes (Signed)
Subjective:     Brandy Beasley is a 32 y.o. female here for a routine exam.  Current complaints: Patient reports she has some burning or itching at implantation site. Patient states she has AUB with the implant. This is her second device and she did not have these concerns with the previous device.  Personal health questionnaire reviewed: yes.   Gynecologic History No LMP recorded. Contraception: Nexplanon  Obstetric History OB History  No data available     The following portions of the patient's history were reviewed and updated as appropriate: allergies, current medications, past family history, past medical history, past social history, past surgical history and problem list.  Review of Systems Pertinent items are noted in HPI.    Objective:    Extremities: Left upper arm palpated and Nexplanon rod felt.    Assessment:    Nexplanon Removal requested.   Plan:    Contraception: none. Follow up in: 2 weeks.   Nexplanon removed.

## 2013-01-12 NOTE — Progress Notes (Signed)
NEXPLANON REMOVAL NOTE  Date of LMP:   unknown  Contraception used: *Nexplanon   Indications:  The patient desires contraception.  She understands risks, benefits, and alternatives to Implanon and would like to proceed.  Anesthesia:   Lidocaine 1% plain.  Procedure:  A time-out was performed confirming the procedure and the patient's allergy status.  Complications: None                      The rod was palpated and the area was sterilely prepped.  The area beneath the distal tip was anesthetized with 1% xylocaine and the skin incised                       Over the tip and the tip was exposed, grasped with forcep and removed intact.   Steri strip                       And a bandage applied and the arm was wrapped with gauze bandage.  The patient tolerated well.  Instructions:  The patient was instructed to remove the dressing in 24 hours and that some bruising is to be expected.  She was advised to use over the counter analgesics as needed for any pain at the site.  She is to keep the area dry for 24 hours and to call if her hand or arm becomes cold, numb, or blue.  Return visit:  Return in 2 weeks 

## 2013-01-27 ENCOUNTER — Ambulatory Visit: Payer: Medicaid Other | Admitting: Obstetrics

## 2013-02-12 ENCOUNTER — Encounter: Payer: Self-pay | Admitting: Obstetrics

## 2013-02-12 ENCOUNTER — Ambulatory Visit (INDEPENDENT_AMBULATORY_CARE_PROVIDER_SITE_OTHER): Payer: Medicaid Other | Admitting: Obstetrics

## 2013-02-12 VITALS — BP 110/69 | HR 88 | Temp 99.1°F | Ht 65.5 in | Wt 210.0 lb

## 2013-02-12 DIAGNOSIS — K219 Gastro-esophageal reflux disease without esophagitis: Secondary | ICD-10-CM | POA: Insufficient documentation

## 2013-02-12 DIAGNOSIS — Z3201 Encounter for pregnancy test, result positive: Secondary | ICD-10-CM

## 2013-02-12 DIAGNOSIS — N760 Acute vaginitis: Secondary | ICD-10-CM

## 2013-02-12 DIAGNOSIS — Z0000 Encounter for general adult medical examination without abnormal findings: Secondary | ICD-10-CM

## 2013-02-12 DIAGNOSIS — A499 Bacterial infection, unspecified: Secondary | ICD-10-CM

## 2013-02-12 DIAGNOSIS — B9689 Other specified bacterial agents as the cause of diseases classified elsewhere: Secondary | ICD-10-CM

## 2013-02-12 DIAGNOSIS — N912 Amenorrhea, unspecified: Secondary | ICD-10-CM

## 2013-02-12 LAB — POCT URINE PREGNANCY: Preg Test, Ur: POSITIVE

## 2013-02-12 MED ORDER — VITAFOL-ONE 29-1-200 MG PO CAPS: 1.0000 | ORAL_CAPSULE | Freq: Every day | ORAL | Status: DC

## 2013-02-12 MED ORDER — CLINDAMYCIN HCL 300 MG PO CAPS: 300.0000 mg | ORAL_CAPSULE | Freq: Three times a day (TID) | ORAL | Status: DC

## 2013-02-12 MED ORDER — OMEPRAZOLE 20 MG PO CPDR: 20.0000 mg | DELAYED_RELEASE_CAPSULE | Freq: Two times a day (BID) | ORAL | Status: DC

## 2013-02-12 NOTE — Progress Notes (Signed)
Pt in office for annual GYN exam, Nexplanon removed Jan 12 2013, Last menstrual cycle 01/08/2013, not currently using birth control. States she would like to get pregnant.  PE:       Breasts - No masses or tenderness.       Abdomen - soft, NT.       Pelvic:  NEFG.  Vagina and cervix normal.  Uterus NSSC.  NT.  A/P:  Annual exam.  Doing well.                                                                                                                                                                                                                         Early pregnancy.  PNV Rx.          BV.  Clindamycin Rx.          F/U for NOB visit in 6 weeks.

## 2013-02-13 LAB — PAP IG W/ RFLX HPV ASCU

## 2013-02-13 LAB — WET PREP BY MOLECULAR PROBE
Candida species: NEGATIVE
Trichomonas vaginosis: NEGATIVE

## 2013-03-21 ENCOUNTER — Inpatient Hospital Stay (HOSPITAL_COMMUNITY)
Admission: AD | Admit: 2013-03-21 | Discharge: 2013-03-22 | Disposition: A | Discharge status: Home / Self Care | Payer: Medicaid Other | Source: Ambulatory Visit | Attending: Obstetrics | Admitting: Obstetrics

## 2013-03-21 DIAGNOSIS — O99891 Other specified diseases and conditions complicating pregnancy: Secondary | ICD-10-CM | POA: Insufficient documentation

## 2013-03-21 DIAGNOSIS — R51 Headache: Secondary | ICD-10-CM | POA: Insufficient documentation

## 2013-03-21 DIAGNOSIS — M549 Dorsalgia, unspecified: Secondary | ICD-10-CM

## 2013-03-22 ENCOUNTER — Encounter (HOSPITAL_COMMUNITY): Payer: Self-pay | Admitting: *Deleted

## 2013-03-22 DIAGNOSIS — O9989 Other specified diseases and conditions complicating pregnancy, childbirth and the puerperium: Secondary | ICD-10-CM

## 2013-03-22 LAB — WET PREP, GENITAL
Clue Cells Wet Prep HPF POC: NONE SEEN
Yeast Wet Prep HPF POC: NONE SEEN

## 2013-03-22 LAB — CBC WITH DIFFERENTIAL/PLATELET
Basophils Absolute: 0 10*3/uL (ref 0.0–0.1)
Basophils Relative: 0 % (ref 0–1)
Eosinophils Absolute: 0.4 10*3/uL (ref 0.0–0.7)
Eosinophils Relative: 4 % (ref 0–5)
MCH: 29.3 pg (ref 26.0–34.0)
MCV: 85.9 fL (ref 78.0–100.0)
Neutrophils Relative %: 57 % (ref 43–77)
Platelets: 149 10*3/uL — ABNORMAL LOW (ref 150–400)
RDW: 12.8 % (ref 11.5–15.5)

## 2013-03-22 LAB — URINALYSIS, ROUTINE W REFLEX MICROSCOPIC
Bilirubin Urine: NEGATIVE
Glucose, UA: NEGATIVE mg/dL
Hgb urine dipstick: NEGATIVE
Protein, ur: NEGATIVE mg/dL
Specific Gravity, Urine: 1.025 (ref 1.005–1.030)

## 2013-03-22 LAB — POCT PREGNANCY, URINE: Preg Test, Ur: POSITIVE — AB

## 2013-03-22 MED ORDER — ACETAMINOPHEN 500 MG PO TABS
1000.0000 mg | ORAL_TABLET | Freq: Once | ORAL | Status: AC
  Administered 2013-03-22: 1000 mg via ORAL
  Filled 2013-03-22: qty 2

## 2013-03-22 NOTE — MAU Provider Note (Signed)
History     CSN: [id number]  Arrival date and time: 03/21/13 2355   First Provider Initiated Contact with Patient 03/22/13 0035      Chief Complaint  Patient presents with  . Chills  . Back Pain  . Headache   Back Pain Associated symptoms include headaches.  Headache  Associated symptoms include back pain.    Brandy Beasley is a 32 y.o. [phone] at 9 weeks who presents today with back pain, frequency and chills. She states that all of those symptoms have been going on for a week. She has not had a fever at home. She states that she "feels cold all the time". She denies any vaginal bleeding, vaginal discharge or cramping/abdominal pain.   Past Medical History  Diagnosis Date  . Medical history non-contributory     Past Surgical History  Procedure Laterality Date  . Cholecystectomy      Family History  Problem Relation Age of Onset  . Hypertension Other   . Diabetes Other   . Cancer Other   . Hypertension Mother   . HIV Father   . Diabetes Maternal Grandmother   . Hypertension Maternal Grandmother   . Cancer Maternal Grandfather     History  Substance Use Topics  . Smoking status: Current Every Day Smoker    Types: Cigarettes  . Smokeless tobacco: Not on file     Comment: states she is cutting back  . Alcohol Use: No    Allergies: No Known Allergies  Prescriptions prior to admission  Medication Sig Dispense Refill  . omeprazole (PRILOSEC) 20 MG capsule Take 1 capsule (20 mg total) by mouth 2 (two) times daily before a meal.  60 capsule  11  . Prenatal Vit-FePoly-FA-DHA (VITAFOL-ONE) 29-1-200 MG CAPS Take 1 capsule by mouth daily before breakfast.  30 capsule  11  . clindamycin (CLEOCIN) 300 MG capsule Take 1 capsule (300 mg total) by mouth 3 (three) times daily.  30 capsule  0    Review of Systems  Musculoskeletal: Positive for back pain.  Neurological: Positive for headaches.   Physical Exam   Blood pressure 130/65, pulse 90, temperature 98.3 F  (36.8 C), temperature source Oral, resp. rate 18, height 5\' 5"  (1.651 m), weight 97.16 kg (214 lb 3.2 oz), last menstrual period 01/12/2013.  Physical Exam  Nursing note and vitals reviewed. Constitutional: She is oriented to person, place, and time. She appears well-developed and well-nourished. No distress.  Afebrile   Cardiovascular: Normal rate.   Respiratory: Effort normal.  GI: Soft. There is no tenderness.  Genitourinary:  No CVA tenderness External: no lesion Vagina: small amount of white discharge Cervix: pink, smooth, no CMT Uterus: about 8-9 weeks size  Adnexa: NT   Musculoskeletal:  Tenderness and muscle tightness in the thoracic region of the back.   Neurological: She is alert and oriented to person, place, and time.  Skin: Skin is warm and dry.  Psychiatric: She has a normal mood and affect.    MAU Course  Procedures  Results for orders placed during the hospital encounter of 03/21/13 (from the past 24 hour(s))  URINALYSIS, ROUTINE W REFLEX MICROSCOPIC     Status: None   Collection Time    03/22/13 12:10 AM      Result Value Range   Color, Urine YELLOW  YELLOW   APPearance CLEAR  CLEAR   Specific Gravity, Urine 1.025  1.005 - 1.030   pH 6.0  5.0 - 8.0   Glucose, UA  NEGATIVE  NEGATIVE mg/dL   Hgb urine dipstick NEGATIVE  NEGATIVE   Bilirubin Urine NEGATIVE  NEGATIVE   Ketones, ur NEGATIVE  NEGATIVE mg/dL   Protein, ur NEGATIVE  NEGATIVE mg/dL   Urobilinogen, UA 0.2  0.0 - 1.0 mg/dL   Nitrite NEGATIVE  NEGATIVE   Leukocytes, UA NEGATIVE  NEGATIVE  POCT PREGNANCY, URINE     Status: Abnormal   Collection Time    03/22/13 12:15 AM      Result Value Range   Preg Test, Ur POSITIVE (*) NEGATIVE  CBC WITH DIFFERENTIAL     Status: Abnormal   Collection Time    03/22/13 12:30 AM      Result Value Range   WBC 10.4  4.0 - 10.5 K/uL   RBC 3.96  3.87 - 5.11 MIL/uL   Hemoglobin 11.6 (*) 12.0 - 15.0 g/dL   HCT 16.1 (*) 09.6 - 04.5 %   MCV 85.9  78.0 - 100.0 fL    MCH 29.3  26.0 - 34.0 pg   MCHC 34.1  30.0 - 36.0 g/dL   RDW 40.9  81.1 - 91.4 %   Platelets 149 (*) 150 - 400 K/uL   Neutrophils Relative % 57  43 - 77 %   Neutro Abs 5.9  1.7 - 7.7 K/uL   Lymphocytes Relative 31  12 - 46 %   Lymphs Abs 3.2  0.7 - 4.0 K/uL   Monocytes Relative 8  3 - 12 %   Monocytes Absolute 0.8  0.1 - 1.0 K/uL   Eosinophils Relative 4  0 - 5 %   Eosinophils Absolute 0.4  0.0 - 0.7 K/uL   Basophils Relative 0  0 - 1 %   Basophils Absolute 0.0  0.0 - 0.1 K/uL  WET PREP, GENITAL     Status: Abnormal   Collection Time    03/22/13 12:40 AM      Result Value Range   Yeast Wet Prep HPF POC NONE SEEN  NONE SEEN   Trich, Wet Prep NONE SEEN  NONE SEEN   Clue Cells Wet Prep HPF POC NONE SEEN  NONE SEEN   WBC, Wet Prep HPF POC FEW (*) NONE SEEN     Assessment and Plan   1. Back pain complicating pregnancy in first trimester      Medication List         clindamycin 300 MG capsule  Commonly known as:  CLEOCIN  Take 1 capsule (300 mg total) by mouth 3 (three) times daily.     omeprazole 20 MG capsule  Commonly known as:  PRILOSEC  Take 1 capsule (20 mg total) by mouth 2 (two) times daily before a meal.     VITAFOL-ONE 29-1-200 MG Caps  Take 1 capsule by mouth daily before breakfast.       Follow-up Information   Follow up with HARPER,CHARLES A, MD. (as scheduled )    Specialty:  Obstetrics and Gynecology   Contact information:   [address] [place] [address] [phone]      Return to MAU as needed   Tawnya Crook 03/22/2013, 1:11 AM

## 2013-03-22 NOTE — MAU Note (Signed)
Pt LMP 01/12/2013, having chills and back pain x 1 day, frequency with urination x 1 wk.

## 2013-03-23 LAB — GC/CHLAMYDIA PROBE AMP: CT Probe RNA: NEGATIVE

## 2013-03-26 ENCOUNTER — Ambulatory Visit (INDEPENDENT_AMBULATORY_CARE_PROVIDER_SITE_OTHER): Payer: Medicaid Other | Admitting: Obstetrics

## 2013-03-26 ENCOUNTER — Encounter: Payer: Self-pay | Admitting: Obstetrics

## 2013-03-26 VITALS — BP 123/76 | Temp 98.6°F | Wt 210.0 lb

## 2013-03-26 DIAGNOSIS — Z113 Encounter for screening for infections with a predominantly sexual mode of transmission: Secondary | ICD-10-CM

## 2013-03-26 DIAGNOSIS — Z3480 Encounter for supervision of other normal pregnancy, unspecified trimester: Secondary | ICD-10-CM

## 2013-03-26 DIAGNOSIS — Z3481 Encounter for supervision of other normal pregnancy, first trimester: Secondary | ICD-10-CM

## 2013-03-26 LAB — POCT URINALYSIS DIPSTICK
Nitrite, UA: NEGATIVE
Urobilinogen, UA: NEGATIVE
pH, UA: 7

## 2013-03-26 LAB — OB RESULTS CONSOLE GC/CHLAMYDIA
Chlamydia: NEGATIVE
Gonorrhea: NEGATIVE

## 2013-03-26 NOTE — Progress Notes (Signed)
Pulse 81 Subjective:    Brandy Beasley is being seen today for her first obstetrical visit.  This is a planned pregnancy. She is at [redacted]w[redacted]d gestation. Her obstetrical history is significant for obesity,gestational diabetes. Relationship with FOB: significant other, living together. Patient does intend to breast feed.  Pt is having nausea and vomiting daily for past 3 weeks.  Pt is also having trouble sleeping at night.  Pregnancy history fully reviewed.  Menstrual History: OB History   Grav Para Term Preterm Abortions TAB SAB Ect Mult Living   5 2 2  2 1 1   2       Menarche age: 25  Patient's last menstrual period was 01/12/2013.    The following portions of the patient's history were reviewed and updated as appropriate: allergies, current medications, past family history, past medical history, past social history, past surgical history and problem list.  Review of Systems Pertinent items are noted in HPI.    Objective:    General appearance: alert and no distress Abdomen: normal findings: soft, non-tender Pelvic: cervix normal in appearance, external genitalia normal, no adnexal masses or tenderness, no cervical motion tenderness, rectovaginal septum normal, vagina normal without discharge and uterus 10 weeks size, soft and NT.    Assessment:    Pregnancy at [redacted]w[redacted]d weeks    Plan:    Initial labs drawn. Prenatal vitamins.  Counseling provided regarding continued use of seat belts, cessation of alcohol consumption, smoking or use of illicit drugs; infection precautions i.e., influenza/TDAP immunizations, toxoplasmosis,CMV, parvovirus, listeria and varicella; workplace safety, exercise during pregnancy; routine dental care, safe medications, sexual activity, hot tubs, saunas, pools, travel, caffeine use, fish and methlymercury, potential toxins, hair treatments, varicose veins Weight gain recommendations per IOM guidelines reviewed: underweight/BMI< 18.5--> gain 28 - 40 lbs; normal  weight/BMI 18.5 - 24.9--> gain 25 - 35 lbs; overweight/BMI 25 - 29.9--> gain 15 - 25 lbs; obese/BMI >30->gain  11 - 20 lbs Problem list reviewed and updated. FIRST/CF mutation testing/NIPT/QUAD SCREEN discussed: requested. Role of ultrasound in pregnancy discussed; fetal survey: requested. Amniocentesis discussed: not indicated. VBAC calculator score: VBAC consent form provided Follow up in 2 weeks. 50% of 20 min visit spent on counseling and coordination of care.

## 2013-03-27 LAB — OBSTETRIC PANEL
Antibody Screen: NEGATIVE
Basophils Absolute: 0 10*3/uL (ref 0.0–0.1)
Basophils Relative: 0 % (ref 0–1)
HCT: 38.2 % (ref 36.0–46.0)
MCHC: 34.6 g/dL (ref 30.0–36.0)
Monocytes Absolute: 0.4 10*3/uL (ref 0.1–1.0)
Neutro Abs: 4.5 10*3/uL (ref 1.7–7.7)
Neutrophils Relative %: 55 % (ref 43–77)
Platelets: 197 10*3/uL (ref 150–400)
RDW: 14 % (ref 11.5–15.5)

## 2013-03-27 LAB — VARICELLA ZOSTER ANTIBODY, IGG: Varicella IgG: 1426 Index — ABNORMAL HIGH (ref <135.00)

## 2013-03-30 ENCOUNTER — Encounter: Payer: Self-pay | Admitting: *Deleted

## 2013-03-30 ENCOUNTER — Emergency Department (HOSPITAL_COMMUNITY)
Admission: EM | Admit: 2013-03-30 | Discharge: 2013-03-30 | Disposition: A | Discharge status: Home / Self Care | Payer: Medicaid Other | Attending: Emergency Medicine | Admitting: Emergency Medicine

## 2013-03-30 ENCOUNTER — Encounter (HOSPITAL_COMMUNITY): Payer: Self-pay | Admitting: Emergency Medicine

## 2013-03-30 ENCOUNTER — Ambulatory Visit (INDEPENDENT_AMBULATORY_CARE_PROVIDER_SITE_OTHER): Payer: Medicaid Other | Admitting: Obstetrics

## 2013-03-30 VITALS — BP 130/78 | Temp 98.7°F | Wt 214.0 lb

## 2013-03-30 DIAGNOSIS — O9989 Other specified diseases and conditions complicating pregnancy, childbirth and the puerperium: Secondary | ICD-10-CM | POA: Insufficient documentation

## 2013-03-30 DIAGNOSIS — F17200 Nicotine dependence, unspecified, uncomplicated: Secondary | ICD-10-CM | POA: Insufficient documentation

## 2013-03-30 DIAGNOSIS — Z79899 Other long term (current) drug therapy: Secondary | ICD-10-CM | POA: Insufficient documentation

## 2013-03-30 DIAGNOSIS — Z3480 Encounter for supervision of other normal pregnancy, unspecified trimester: Secondary | ICD-10-CM

## 2013-03-30 DIAGNOSIS — M533 Sacrococcygeal disorders, not elsewhere classified: Secondary | ICD-10-CM | POA: Insufficient documentation

## 2013-03-30 DIAGNOSIS — M545 Low back pain: Secondary | ICD-10-CM

## 2013-03-30 DIAGNOSIS — G8929 Other chronic pain: Secondary | ICD-10-CM

## 2013-03-30 DIAGNOSIS — Z3481 Encounter for supervision of other normal pregnancy, first trimester: Secondary | ICD-10-CM

## 2013-03-30 DIAGNOSIS — O99330 Smoking (tobacco) complicating pregnancy, unspecified trimester: Secondary | ICD-10-CM | POA: Insufficient documentation

## 2013-03-30 LAB — POCT URINALYSIS DIPSTICK
Bilirubin, UA: NEGATIVE
Ketones, UA: NEGATIVE
Leukocytes, UA: NEGATIVE

## 2013-03-30 LAB — HEMOGLOBINOPATHY EVALUATION
Hgb A2 Quant: 2.6 % (ref 2.2–3.2)
Hgb F Quant: 0 % (ref 0.0–2.0)

## 2013-03-30 MED ORDER — HYDROMORPHONE HCL 2 MG PO TABS: 4.0000 mg | ORAL_TABLET | ORAL | Status: DC | PRN

## 2013-03-30 NOTE — ED Notes (Signed)
Pt c/o lower back pain and intermittent tingling in hands "for months."  Pain score 10/10.  Pt sts "I've always had a really bad back, but it got way worse on Friday.  They did an x-ray a few months ago and diagnosed w/ mild disc disease.  I saw my PCP today and he wanted me to come here and get an MRI."  Pt sts she is [redacted] weeks pregnant.

## 2013-03-30 NOTE — Progress Notes (Signed)
Pulse- 75  Pt states she is having pain in her lower back. Pt states she has a maternity belt which is not relieving any pain. Pt states she is also using tylenol and warm baths with no relief.  Pt states she works on her feet for 8 hours a day only receiving a 1 hour lunch break.

## 2013-03-30 NOTE — ED Provider Notes (Signed)
CSN: [id number]     Arrival date & time 03/30/13  1826 History  This chart was scribed for non-physician practitioner, Earley Favor, FNP,working with Raeford Razor, MD, by Karle Plumber, ED Scribe.  This patient was seen in room WTR5/WTR5 and the patient's care was started at 7:11 PM.  Chief Complaint  Patient presents with  . Back Pain   The history is provided by the patient. No language interpreter was used.   HPI Comments:  Brandy Beasley is a 32 y.o. pregnant female who presents to the Emergency Department complaining of severe back pain that has been going on for approximately 6 months. Pt states it has been worsening and she is having a flare-up. She states she has been taking hot baths and taking Tylenol with no relief. She states she saw her OB/GYN today and was prescribed Dilaudid for pain. She states she is scared to take it due to her being pregnant. She states her OB/GYN told her to come here for an emergent MRI and was given a referral to [facility]. Pt becomes angry and tearful by the end of the exam due to frustration from being in pain. She states she took Hydrocodone for pain before she became pregnant with mild temporary relief. She denies bowel or bladder incontinence. Pt is ambulatory.  Past Medical History  Diagnosis Date  . Medical history non-contributory    Past Surgical History  Procedure Laterality Date  . Cholecystectomy     Family History  Problem Relation Age of Onset  . Hypertension Other   . Diabetes Other   . Cancer Other   . Hypertension Mother   . HIV Father   . Diabetes Maternal Grandmother   . Hypertension Maternal Grandmother   . Cancer Maternal Grandfather    History  Substance Use Topics  . Smoking status: Current Every Day Smoker -- 0.50 packs/day    Types: Cigarettes  . Smokeless tobacco: Never Used     Comment: states she is cutting back  . Alcohol Use: No   OB History   Grav Para Term Preterm Abortions TAB SAB Ect Mult  Living   5 2 2  2 1 1   2      Review of Systems  Genitourinary: Negative for dysuria.  Musculoskeletal: Positive for back pain.  All other systems reviewed and are negative.    Allergies  Review of patient's allergies indicates no known allergies.  Home Medications   Current Outpatient Rx  Name  Route  Sig  Dispense  Refill  . acetaminophen (TYLENOL) 325 MG tablet   Oral   Take 650 mg by mouth every 6 (six) hours as needed (pain).         Marland Kitchen ibuprofen (ADVIL,MOTRIN) 200 MG tablet   Oral   Take 200 mg by mouth every 6 (six) hours as needed (pain).         Marland Kitchen omeprazole (PRILOSEC) 20 MG capsule   Oral   Take 1 capsule (20 mg total) by mouth 2 (two) times daily before a meal.   60 capsule   11   . Prenatal Vit-FePoly-FA-DHA (VITAFOL-ONE) 29-1-200 MG CAPS   Oral   Take 1 capsule by mouth daily before breakfast.   30 capsule   11   . HYDROmorphone (DILAUDID) 2 MG tablet   Oral   Take 2 tablets (4 mg total) by mouth every 4 (four) hours as needed for severe pain.   40 tablet   0  Triage Vitals: BP 131/58  Pulse 80  Temp(Src) 98.8 F (37.1 C) (Oral)  Resp 18  SpO2 99%  LMP 01/12/2013 Physical Exam  Nursing note and vitals reviewed. Constitutional: She is oriented to person, place, and time. She appears well-developed and well-nourished.  HENT:  Head: Normocephalic and atraumatic.  Eyes: EOM are normal.  Neck: Normal range of motion.  Cardiovascular: Normal rate.   Pulmonary/Chest: Effort normal.  Musculoskeletal: Normal range of motion. She exhibits no edema and no tenderness.  Neurological: She is alert and oriented to person, place, and time.  Skin: Skin is warm and dry.  Psychiatric: She has a normal mood and affect. Her behavior is normal.    ED Course  Procedures (including critical care time) DIAGNOSTIC STUDIES: Oxygen Saturation is 99% on RA, normal by my interpretation.   COORDINATION OF CARE: 7:17 PM- Will speak with Dr. Juleen China regarding  appropriate course of treatment. Pt verbalizes understanding and agrees to plan.  Medications - No data to display  Labs Review Labs Reviewed - No data to display Imaging Review No results found.  EKG Interpretation   None       MDM   1. Lumbosacral pain, chronic    Patient has been referred back to her orthopedic physician, who can obtain an MRI, as needed.  As an outpatient   I personally performed the services described in this documentation, which was scribed in my presence. The recorded information has been reviewed and is accurate.   Arman Filter, NP 03/30/13 1932

## 2013-03-31 ENCOUNTER — Encounter: Payer: Self-pay | Admitting: Obstetrics

## 2013-03-31 LAB — PAP IG W/ RFLX HPV ASCU

## 2013-04-03 LAB — HUMAN PAPILLOMAVIRUS, HIGH RISK: HPV DNA High Risk: NOT DETECTED

## 2013-04-06 ENCOUNTER — Encounter: Payer: Self-pay | Admitting: Obstetrics

## 2013-04-07 NOTE — ED Provider Notes (Signed)
Medical screening examination/treatment/procedure(s) were conducted as a shared visit with non-physician practitioner(s) and myself.  I personally evaluated the patient during the encounter.  EKG Interpretation   None      32 year old female with atraumatic back pain. Nonfocal neurologic examination. Patient is pregnant has been taking only Tylenol for her symptoms with little relief. She was prescribed narcotics by her OB/GYN physician this recently though. She reports results refer to orthopedics. She says she is told to come for emergent MRI. I agree she may need an MRI, but there is no emergent indication for this. Reassurance was provided. Return precautions were discussed. As needed pain medication. Needs to keep her appointment with orthopedics.  Raeford Razor, MD 04/07/13 2159

## 2013-04-08 ENCOUNTER — Ambulatory Visit (INDEPENDENT_AMBULATORY_CARE_PROVIDER_SITE_OTHER): Payer: Medicaid Other | Admitting: Obstetrics

## 2013-04-08 ENCOUNTER — Encounter: Payer: Self-pay | Admitting: Obstetrics

## 2013-04-08 ENCOUNTER — Other Ambulatory Visit: Payer: Self-pay | Admitting: *Deleted

## 2013-04-08 VITALS — BP 121/75 | Wt 213.0 lb

## 2013-04-08 DIAGNOSIS — A599 Trichomoniasis, unspecified: Secondary | ICD-10-CM

## 2013-04-08 DIAGNOSIS — B9689 Other specified bacterial agents as the cause of diseases classified elsewhere: Secondary | ICD-10-CM

## 2013-04-08 DIAGNOSIS — Z3481 Encounter for supervision of other normal pregnancy, first trimester: Secondary | ICD-10-CM

## 2013-04-08 DIAGNOSIS — O219 Vomiting of pregnancy, unspecified: Secondary | ICD-10-CM

## 2013-04-08 DIAGNOSIS — Z3480 Encounter for supervision of other normal pregnancy, unspecified trimester: Secondary | ICD-10-CM

## 2013-04-08 LAB — POCT URINALYSIS DIPSTICK
Bilirubin, UA: NEGATIVE
Blood, UA: NEGATIVE
Glucose, UA: NEGATIVE
Nitrite, UA: NEGATIVE

## 2013-04-08 MED ORDER — ONDANSETRON HCL 4 MG PO TABS: 4.0000 mg | ORAL_TABLET | Freq: Three times a day (TID) | ORAL | Status: DC | PRN

## 2013-04-08 MED ORDER — METRONIDAZOLE 500 MG PO TABS: ORAL_TABLET | ORAL | Status: DC

## 2013-04-08 NOTE — Progress Notes (Signed)
Pulse- 98  Pt states she is having vaginal pressure. Pt states her back pain is improving with the use of a back brace, rest and medication.

## 2013-04-09 NOTE — L&D Delivery Note (Signed)
Delivery Note At 9:04 AM a viable female was delivered via Vaginal, Spontaneous Delivery (Presentation: Right Occiput Anterior).  APGAR: 9, 9; weight .   Placenta status: Intact, Spontaneous.  Cord: 3 vessels with the following complications: None.  Cord pH: NA  Anesthesia: None  Episiotomy: None Lacerations: None Suture Repair: 3.0 Est. Blood Loss (mL): 300  Mom to postpartum.  Baby to Couplet care / Skin to Skin.  Brandy Beasley 10/20/2013, 9:34 AM

## 2013-05-07 ENCOUNTER — Ambulatory Visit (INDEPENDENT_AMBULATORY_CARE_PROVIDER_SITE_OTHER): Payer: Medicaid Other | Admitting: Obstetrics

## 2013-05-07 ENCOUNTER — Encounter: Payer: Self-pay | Admitting: Obstetrics

## 2013-05-07 VITALS — BP 110/69 | Temp 98.5°F | Wt 213.0 lb

## 2013-05-07 DIAGNOSIS — Z1389 Encounter for screening for other disorder: Secondary | ICD-10-CM

## 2013-05-07 DIAGNOSIS — B373 Candidiasis of vulva and vagina: Secondary | ICD-10-CM | POA: Insufficient documentation

## 2013-05-07 DIAGNOSIS — Z3480 Encounter for supervision of other normal pregnancy, unspecified trimester: Secondary | ICD-10-CM

## 2013-05-07 DIAGNOSIS — B3731 Acute candidiasis of vulva and vagina: Secondary | ICD-10-CM | POA: Insufficient documentation

## 2013-05-07 LAB — POCT URINALYSIS DIPSTICK
BILIRUBIN UA: NEGATIVE
Glucose, UA: NEGATIVE
Ketones, UA: NEGATIVE
Leukocytes, UA: NEGATIVE
Nitrite, UA: NEGATIVE
PH UA: 6
PROTEIN UA: NEGATIVE
RBC UA: NEGATIVE
Spec Grav, UA: 1.015
Urobilinogen, UA: NEGATIVE

## 2013-05-07 MED ORDER — FLUCONAZOLE 150 MG PO TABS: 150.0000 mg | ORAL_TABLET | Freq: Once | ORAL | Status: DC

## 2013-05-07 NOTE — Progress Notes (Signed)
Pulse 93 Pt was recently treated for BV in December. Pt wasn't able to keep the medication down, made her sick. Pt states that she took full treatment despite the trouble she had taking them.  Pt states that she is now having some vaginal itching and irritation.  Pt denies any d/c at this time.  Pt states that she is having some lower pelvic and back pain/pressure.  Pt has had a few dizzy episodes. Pt states that she has fallen to her knees once due to dizziness.

## 2013-05-08 LAB — AFP, QUAD SCREEN
AFP: 29.1 IU/mL
Age Alone: 1:488 {titer}
Curr Gest Age: 16.3 wks.days
HCG, Total: 14258 m[IU]/mL
INH: 122.1 pg/mL
Interpretation-AFP: NEGATIVE
MOM FOR HCG: 0.93
MOM FOR INH: 0.93
MoM for AFP: 1.21
Open Spina bifida: NEGATIVE
Osb Risk: 1:6620 {titer}
TRI 18 SCR RISK EST: NEGATIVE
Trisomy 18 (Edward) Syndrome Interp.: 1:20200 {titer}
uE3 Mom: 0.79
uE3 Value: 0.4 ng/mL

## 2013-06-02 ENCOUNTER — Other Ambulatory Visit: Payer: Medicaid Other

## 2013-06-02 ENCOUNTER — Encounter: Payer: Medicaid Other | Admitting: Obstetrics

## 2013-06-03 ENCOUNTER — Encounter: Payer: Medicaid Other | Admitting: Obstetrics

## 2013-06-10 ENCOUNTER — Encounter: Payer: Self-pay | Admitting: Obstetrics

## 2013-06-10 ENCOUNTER — Ambulatory Visit (INDEPENDENT_AMBULATORY_CARE_PROVIDER_SITE_OTHER): Payer: Medicaid Other

## 2013-06-10 ENCOUNTER — Encounter: Payer: Self-pay | Admitting: *Deleted

## 2013-06-10 ENCOUNTER — Encounter: Payer: Self-pay | Admitting: Advanced Practice Midwife

## 2013-06-10 ENCOUNTER — Ambulatory Visit (INDEPENDENT_AMBULATORY_CARE_PROVIDER_SITE_OTHER): Payer: Medicaid Other | Admitting: Obstetrics

## 2013-06-10 VITALS — BP 120/77 | Temp 97.3°F | Wt 217.0 lb

## 2013-06-10 DIAGNOSIS — Z1389 Encounter for screening for other disorder: Secondary | ICD-10-CM

## 2013-06-10 DIAGNOSIS — B373 Candidiasis of vulva and vagina: Secondary | ICD-10-CM

## 2013-06-10 DIAGNOSIS — O365990 Maternal care for other known or suspected poor fetal growth, unspecified trimester, not applicable or unspecified: Secondary | ICD-10-CM

## 2013-06-10 DIAGNOSIS — Z3480 Encounter for supervision of other normal pregnancy, unspecified trimester: Secondary | ICD-10-CM

## 2013-06-10 DIAGNOSIS — B3731 Acute candidiasis of vulva and vagina: Secondary | ICD-10-CM

## 2013-06-10 LAB — US OB DETAIL + 14 WK

## 2013-06-10 MED ORDER — FLUCONAZOLE 100 MG PO TABS: 100.0000 mg | ORAL_TABLET | Freq: Every day | ORAL | Status: DC

## 2013-06-10 NOTE — Progress Notes (Signed)
Pulse- 85 Patient states she is having vaginal pressure.

## 2013-06-16 ENCOUNTER — Other Ambulatory Visit: Payer: Medicaid Other

## 2013-06-24 ENCOUNTER — Other Ambulatory Visit: Payer: Medicaid Other

## 2013-06-24 ENCOUNTER — Encounter: Payer: Medicaid Other | Admitting: Advanced Practice Midwife

## 2013-06-25 ENCOUNTER — Other Ambulatory Visit: Payer: Medicaid Other

## 2013-06-25 ENCOUNTER — Encounter: Payer: Medicaid Other | Admitting: Advanced Practice Midwife

## 2013-07-02 ENCOUNTER — Other Ambulatory Visit: Payer: Medicaid Other

## 2013-07-02 ENCOUNTER — Ambulatory Visit (INDEPENDENT_AMBULATORY_CARE_PROVIDER_SITE_OTHER): Payer: Medicaid Other | Admitting: Obstetrics & Gynecology

## 2013-07-02 VITALS — BP 105/69 | Temp 98.4°F | Wt 222.0 lb

## 2013-07-02 DIAGNOSIS — IMO0002 Reserved for concepts with insufficient information to code with codable children: Secondary | ICD-10-CM

## 2013-07-02 DIAGNOSIS — Z3480 Encounter for supervision of other normal pregnancy, unspecified trimester: Secondary | ICD-10-CM

## 2013-07-02 DIAGNOSIS — O99330 Smoking (tobacco) complicating pregnancy, unspecified trimester: Secondary | ICD-10-CM

## 2013-07-02 LAB — CBC
HCT: 34.8 % — ABNORMAL LOW (ref 36.0–46.0)
HEMOGLOBIN: 11.8 g/dL — AB (ref 12.0–15.0)
MCH: 30 pg (ref 26.0–34.0)
MCHC: 33.9 g/dL (ref 30.0–36.0)
MCV: 88.5 fL (ref 78.0–100.0)
Platelets: 165 10*3/uL (ref 150–400)
RBC: 3.93 MIL/uL (ref 3.87–5.11)
RDW: 13.6 % (ref 11.5–15.5)
WBC: 12.7 10*3/uL — ABNORMAL HIGH (ref 4.0–10.5)

## 2013-07-02 LAB — POCT URINALYSIS DIPSTICK
BILIRUBIN UA: NEGATIVE
KETONES UA: NEGATIVE
Leukocytes, UA: NEGATIVE
Nitrite, UA: NEGATIVE
Protein, UA: NEGATIVE
RBC UA: NEGATIVE
Spec Grav, UA: 1.015
Urobilinogen, UA: NEGATIVE
pH, UA: 7

## 2013-07-02 NOTE — Progress Notes (Signed)
Pulse:88 Patient states she takes prenatal vitamins 3 or 4 times a week. Patient states the prenatal doesn't sit well on her stomach. Patient states she hasn't felt any movement for sure in the last 3 days. Patient states movement was very faint the first 2 days. Patient states she parked between two cars and had to squeeze to get into her car. Patient states she started cramping at top of abdomen on first day and she is still having cramping at least once a day but it is not as frequent. Patient states she is having vaginal pressure. Patient states she is on her feet a lot at work but if she sits down for an hour or two the pressure goes away. Patient states she is concerned about her smoking. Patient states she was smoking 2 packs a day in the beginning and cut it down to one pack a day and then cut it down to 7-8 cigarettes a day.  Patient states she doesn't want to smoke anymore.

## 2013-07-03 LAB — GLUCOSE TOLERANCE, 2 HOURS W/ 1HR
GLUCOSE, 2 HOUR: 85 mg/dL (ref 70–139)
GLUCOSE: 152 mg/dL (ref 70–170)
Glucose, Fasting: 67 mg/dL — ABNORMAL LOW (ref 70–99)

## 2013-07-03 LAB — HIV ANTIBODY (ROUTINE TESTING W REFLEX): HIV: NONREACTIVE

## 2013-07-03 LAB — RPR

## 2013-07-05 DIAGNOSIS — Z3480 Encounter for supervision of other normal pregnancy, unspecified trimester: Secondary | ICD-10-CM | POA: Insufficient documentation

## 2013-07-05 DIAGNOSIS — O99330 Smoking (tobacco) complicating pregnancy, unspecified trimester: Secondary | ICD-10-CM | POA: Insufficient documentation

## 2013-07-05 NOTE — Patient Instructions (Signed)
Second Trimester of Pregnancy The second trimester is from week 13 through week 28, months 4 through 6. The second trimester is often a time when you feel your best. Your body has also adjusted to being pregnant, and you begin to feel better physically. Usually, morning sickness has lessened or quit completely, you may have more energy, and you may have an increase in appetite. The second trimester is also a time when the fetus is growing rapidly. At the end of the sixth month, the fetus is about 9 inches long and weighs about 1 pounds. You will likely begin to feel the baby move (quickening) between 18 and 20 weeks of the pregnancy. BODY CHANGES Your body goes through many changes during pregnancy. The changes vary from woman to woman.   Your weight will continue to increase. You will notice your lower abdomen bulging out.  You may begin to get stretch marks on your hips, abdomen, and breasts.  You may develop headaches that can be relieved by medicines approved by your caregiver.  You may urinate more often because the fetus is pressing on your bladder.  You may develop or continue to have heartburn as a result of your pregnancy.  You may develop constipation because certain hormones are causing the muscles that push waste through your intestines to slow down.  You may develop hemorrhoids or swollen, bulging veins (varicose veins).  You may have back pain because of the weight gain and pregnancy hormones relaxing your joints between the bones in your pelvis and as a result of a shift in weight and the muscles that support your balance.  Your breasts will continue to grow and be tender.  Your gums may bleed and may be sensitive to brushing and flossing.  Dark spots or blotches (chloasma, mask of pregnancy) may develop on your face. This will likely fade after the baby is born.  A dark line from your belly button to the pubic area (linea nigra) may appear. This will likely fade after the  baby is born. WHAT TO EXPECT AT YOUR PRENATAL VISITS During a routine prenatal visit:  You will be weighed to make sure you and the fetus are growing normally.  Your blood pressure will be taken.  Your abdomen will be measured to track your baby's growth.  The fetal heartbeat will be listened to.  Any test results from the previous visit will be discussed. Your caregiver may ask you:  How you are feeling.  If you are feeling the baby move.  If you have had any abnormal symptoms, such as leaking fluid, bleeding, severe headaches, or abdominal cramping.  If you have any questions. Other tests that may be performed during your second trimester include:  Blood tests that check for:  Low iron levels (anemia).  Gestational diabetes (between 24 and 28 weeks).  Rh antibodies.  Urine tests to check for infections, diabetes, or protein in the urine.  An ultrasound to confirm the proper growth and development of the baby.  An amniocentesis to check for possible genetic problems.  Fetal screens for spina bifida and Down syndrome. HOME CARE INSTRUCTIONS   Avoid all smoking, herbs, alcohol, and unprescribed drugs. These chemicals affect the formation and growth of the baby.  Follow your caregiver's instructions regarding medicine use. There are medicines that are either safe or unsafe to take during pregnancy.  Exercise only as directed by your caregiver. Experiencing uterine cramps is a good sign to stop exercising.  Continue to eat regular,   healthy meals.  Wear a good support bra for breast tenderness.  Do not use hot tubs, steam rooms, or saunas.  Wear your seat belt at all times when driving.  Avoid raw meat, uncooked cheese, cat litter boxes, and soil used by cats. These carry germs that can cause birth defects in the baby.  Take your prenatal vitamins.  Try taking a stool softener (if your caregiver approves) if you develop constipation. Eat more high-fiber foods,  such as fresh vegetables or fruit and whole grains. Drink plenty of fluids to keep your urine clear or pale yellow.  Take warm sitz baths to soothe any pain or discomfort caused by hemorrhoids. Use hemorrhoid cream if your caregiver approves.  If you develop varicose veins, wear support hose. Elevate your feet for 15 minutes, 3 4 times a day. Limit salt in your diet.  Avoid heavy lifting, wear low heel shoes, and practice good posture.  Rest with your legs elevated if you have leg cramps or low back pain.  Visit your dentist if you have not gone yet during your pregnancy. Use a soft toothbrush to brush your teeth and be gentle when you floss.  A sexual relationship may be continued unless your caregiver directs you otherwise.  Continue to go to all your prenatal visits as directed by your caregiver. SEEK MEDICAL CARE IF:   You have dizziness.  You have mild pelvic cramps, pelvic pressure, or nagging pain in the abdominal area.  You have persistent nausea, vomiting, or diarrhea.  You have a bad smelling vaginal discharge.  You have pain with urination. SEEK IMMEDIATE MEDICAL CARE IF:   You have a fever.  You are leaking fluid from your vagina.  You have spotting or bleeding from your vagina.  You have severe abdominal cramping or pain.  You have rapid weight gain or loss.  You have shortness of breath with chest pain.  You notice sudden or extreme swelling of your face, hands, ankles, feet, or legs.  You have not felt your baby move in over an hour.  You have severe headaches that do not go away with medicine.  You have vision changes. Document Released: 03/20/2001 Document Revised: 11/26/2012 Document Reviewed: 05/27/2012 [employer] Patient Information 2014 [employer].  

## 2013-07-09 DIAGNOSIS — Z3480 Encounter for supervision of other normal pregnancy, unspecified trimester: Secondary | ICD-10-CM

## 2013-07-23 ENCOUNTER — Ambulatory Visit (INDEPENDENT_AMBULATORY_CARE_PROVIDER_SITE_OTHER): Payer: Medicaid Other | Admitting: Obstetrics & Gynecology

## 2013-07-23 VITALS — BP 123/75 | Temp 98.3°F | Wt 222.0 lb

## 2013-07-23 DIAGNOSIS — Z3480 Encounter for supervision of other normal pregnancy, unspecified trimester: Secondary | ICD-10-CM

## 2013-07-23 LAB — POCT URINALYSIS DIPSTICK
Bilirubin, UA: NEGATIVE
GLUCOSE UA: NEGATIVE
Ketones, UA: NEGATIVE
Leukocytes, UA: NEGATIVE
NITRITE UA: NEGATIVE
PH UA: 6
Protein, UA: NEGATIVE
RBC UA: NEGATIVE
Spec Grav, UA: 1.02
UROBILINOGEN UA: NEGATIVE

## 2013-07-23 NOTE — Progress Notes (Signed)
Subjective:    Brandy Beasley is a 33 y.o. female being seen today for her obstetrical visit. She is at 4762w3d gestation. Patient reports having trouble urinating. Patient reports blurred vision and dizziness.. Fetal movement: normal.  Menstrual History: OB History   Grav Para Term Preterm Abortions TAB SAB Ect Mult Living   5 2 2  2 1 1   2       Menarche age: [age]  Patient's last menstrual period was 01/12/2013.    Past Medical History  Diagnosis Date  . Medical history non-contributory     Past Surgical History  Procedure Laterality Date  . Cholecystectomy     Current Outpatient Prescriptions  Medication Sig Dispense Refill  . omeprazole (PRILOSEC) 20 MG capsule Take 1 capsule (20 mg total) by mouth 2 (two) times daily before a meal.  60 capsule  11  . ondansetron (ZOFRAN) 4 MG tablet Take 1 tablet (4 mg total) by mouth every 8 (eight) hours as needed for nausea or vomiting.  30 tablet  0  . Prenatal Vit-FePoly-FA-DHA (VITAFOL-ONE) 29-1-200 MG CAPS Take 1 capsule by mouth daily before breakfast.  30 capsule  11   No current facility-administered medications for this visit.     No Known Allergies  History  Substance Use Topics  . Smoking status: Current Every Day Smoker -- 0.50 packs/day    Types: Cigarettes  . Smokeless tobacco: Never Used     Comment: states she is cutting back  . Alcohol Use: No    Family History  Problem Relation Age of Onset  . Hypertension Other   . Diabetes Other   . Cancer Other   . Hypertension Mother   . HIV Father   . Diabetes Maternal Grandmother   . Hypertension Maternal Grandmother   . Cancer Maternal Grandfather      Review of Systems Constitutional: negative for anorexia Gastrointestinal: negative for abdominal pain Genitourinary:negative for vaginal discharge Musculoskeletal:negative for back pain Behavioral/Psych: negative for depression and tobacco use   Objective:    LMP 01/12/2013 FHT:  150 BPM  Uterine Size: size  equals dates  Presentation: unsure     Assessment:    Pregnancy @ 5254w4d weeks  Tobacco use from 2 ppd--> 4 cigarettes/day  Plan:     labs reviewed, problem list updated Continue smoking cessation efforts TDAP offered  Rhogam given for RH negative Follow up in 2 Weeks.

## 2013-07-24 NOTE — Patient Instructions (Signed)
Tetanus, Diphtheria (Td) Vaccine What You Need to Know WHY GET VACCINATED? Tetanus  and diphtheria are very serious diseases. They are rare in the [place] today, but people who do become infected often have severe complications. Td vaccine is used to protect adolescents and adults from both of these diseases. Both tetanus and diphtheria are infections caused by bacteria. Diphtheria spreads from person to person through coughing or sneezing. Tetanus-causing bacteria enter the body through cuts, scratches, or wounds. TETANUS (Lockjaw) causes painful muscle tightening and stiffness, usually all over the body.  It can lead to tightening of muscles in the head and neck so you can't open your mouth, swallow, or sometimes even breathe. Tetanus kills about 1 out of every 5 people who are infected. DIPHTHERIA can cause a thick coating to form in the back of the throat.  It can lead to breathing problems, paralysis, heart failure, and death. Before vaccines, the [place] saw as many as 200,000 cases a year of diphtheria and hundreds of cases of tetanus. Since vaccination began, cases of both diseases have dropped by about 99%. TD VACCINE Td vaccine can protect adolescents and adults from tetanus and diphtheria. Td is usually given as a booster dose every 10 years but it can also be given earlier after a severe and dirty wound or burn. Your doctor can give you more information. Td may safely be given at the same time as other vaccines. SOME PEOPLE SHOULD NOT GET THIS VACCINE  If you ever had a life-threatening allergic reaction after a dose of any tetanus or diphtheria containing vaccine, OR if you have a severe allergy to any part of this vaccine, you should not get Td. Tell your doctor if you have any severe allergies.  Talk to your doctor if you:  have epilepsy or another nervous system problem,  had severe pain or swelling after any vaccine containing diphtheria or tetanus,  ever had  Guillain Barr Syndrome (GBS),  aren't feeling well on the day the shot is scheduled. RISKS OF A VACCINE REACTION With a vaccine, like any medicine, there is a chance of side effects. These are usually mild and go away on their own. Serious side effects are also possible, but are very rare. Most people who get Td vaccine do not have any problems with it. Mild Problems  following Td (Did not interfere with activities)  Pain where the shot was given (about 8 people in 10)  Redness or swelling where the shot was given (about 1 person in 3)  Mild fever (about 1 person in 15)  Headache or Tiredness (uncommon) Moderate Problems following Td (Interfered with activities, but did not require medical attention)  Fever over 102 F (38.9 C) (rare) Severe Problems  following Td (Unable to perform usual activities; required medical attention)  Swelling, severe pain, bleeding, or redness in the arm where the shot was given (rare). Problems that could happen after any vaccine:  Brief fainting spells can happen after any medical procedure, including vaccination. Sitting or lying down for about 15 minutes can help prevent fainting, and injuries caused by a fall. Tell your doctor if you feel dizzy, or have vision changes or ringing in the ears.  Severe shoulder pain and reduced range of motion in the arm where a shot was given can happen, very rarely, after a vaccination.  Severe allergic reactions from a vaccine are very rare, estimated at less than 1 in a million doses. If one were to occur, it would   usually be within a few minutes to a few hours after the vaccination. WHAT IF THERE IS A SERIOUS REACTION? What should I look for?  Look for anything that concerns you, such as signs of a severe allergic reaction, very high fever, or behavior changes. Signs of a severe allergic reaction can include hives, swelling of the face and throat, difficulty breathing, a fast heartbeat, dizziness, and  weakness. These would usually start a few minutes to a few hours after the vaccination. What should I do?  If you think it is a severe allergic reaction or other emergency that can't wait, call 911 or get the person to the nearest hospital. Otherwise, call your doctor.  Afterward, the reaction should be reported to the Vaccine Adverse Event Reporting System (VAERS). Your doctor might file this report, or, you can do it yourself through the VAERS website or by calling 1-800-822-7967. VAERS is only for reporting reactions. They do not give medical advice. THE NATIONAL VACCINE INJURY COMPENSATION PROGRAM The National Vaccine Injury Compensation Program (VICP) is a federal program that was created to compensate people who may have been injured by certain vaccines. Persons who believe they may have been injured by a vaccine can learn about the program and about filing a claim by calling 1-800-338-2382 or visiting the VICP website. HOW CAN I LEARN MORE?  Ask your doctor.  Contact your local or state health department.  Contact the Centers for Disease Control and Prevention (CDC):  Call 1-800-232-4636 (1-800-CDC-INFO)  Visit CDC's vaccines website CDC Td Vaccine Interim VIS (05/13/12) Document Released: 01/21/2006 Document Revised: 07/21/2012 Document Reviewed: 07/16/2012 ExitCare Patient Information 2014 [employer].  

## 2013-08-03 ENCOUNTER — Inpatient Hospital Stay (HOSPITAL_COMMUNITY)
Admission: AD | Admit: 2013-08-03 | Discharge: 2013-08-03 | Disposition: A | Discharge status: Home / Self Care | Payer: Medicaid Other | Source: Ambulatory Visit | Attending: Obstetrics & Gynecology | Admitting: Obstetrics & Gynecology

## 2013-08-03 ENCOUNTER — Encounter (HOSPITAL_COMMUNITY): Payer: Self-pay | Admitting: *Deleted

## 2013-08-03 DIAGNOSIS — R12 Heartburn: Secondary | ICD-10-CM | POA: Insufficient documentation

## 2013-08-03 DIAGNOSIS — O26853 Spotting complicating pregnancy, third trimester: Secondary | ICD-10-CM

## 2013-08-03 DIAGNOSIS — O99330 Smoking (tobacco) complicating pregnancy, unspecified trimester: Secondary | ICD-10-CM | POA: Insufficient documentation

## 2013-08-03 DIAGNOSIS — R109 Unspecified abdominal pain: Secondary | ICD-10-CM | POA: Insufficient documentation

## 2013-08-03 DIAGNOSIS — O26859 Spotting complicating pregnancy, unspecified trimester: Secondary | ICD-10-CM | POA: Insufficient documentation

## 2013-08-03 DIAGNOSIS — O368190 Decreased fetal movements, unspecified trimester, not applicable or unspecified: Secondary | ICD-10-CM | POA: Insufficient documentation

## 2013-08-03 LAB — URINALYSIS, ROUTINE W REFLEX MICROSCOPIC
Bilirubin Urine: NEGATIVE
Glucose, UA: NEGATIVE mg/dL
Hgb urine dipstick: NEGATIVE
Ketones, ur: NEGATIVE mg/dL
LEUKOCYTES UA: NEGATIVE
NITRITE: NEGATIVE
Protein, ur: NEGATIVE mg/dL
SPECIFIC GRAVITY, URINE: 1.025 (ref 1.005–1.030)
Urobilinogen, UA: 0.2 mg/dL (ref 0.0–1.0)
pH: 6 (ref 5.0–8.0)

## 2013-08-03 LAB — WET PREP, GENITAL
Clue Cells Wet Prep HPF POC: NONE SEEN
Trich, Wet Prep: NONE SEEN
Yeast Wet Prep HPF POC: NONE SEEN

## 2013-08-03 MED ORDER — FAMOTIDINE 20 MG PO TABS: 20.0000 mg | ORAL_TABLET | Freq: Two times a day (BID) | ORAL | Status: DC

## 2013-08-03 NOTE — Discharge Instructions (Signed)
Vaginal Bleeding During Pregnancy, Third Trimester A small amount of bleeding (spotting) from the vagina is relatively common in pregnancy. Various things can cause bleeding or spotting in pregnancy. Sometimes the bleeding is normal and is not a problem. However, bleeding during the third trimester can also be a sign of something serious for the mother and the baby. Be sure to tell your health care provider about any vaginal bleeding right away.  Some possible causes of vaginal bleeding during the third trimester include:   The placenta may be partially or completely covering the opening to the cervix (placenta previa).   The placenta may have separated from the uterus (abruption of the placenta).   There may be an infection or growth on the cervix.   You may be starting labor, called discharging of the mucus plug.   The placenta may grow into the muscle layer of the uterus (placenta accreta).  HOME CARE INSTRUCTIONS  Watch your condition for any changes. The following actions may help to lessen any discomfort you are feeling:   Follow your health care provider's instructions for limiting your activity. If your health care provider orders bed rest, you may need to stay in bed and only get up to use the bathroom. However, your health care provider may allow you to continue light activity.  If needed, make plans for someone to help with your regular activities and responsibilities while you are on bed rest.  Keep track of the number of pads you use each day, how often you change pads, and how soaked (saturated) they are. Write this down.  Do not use tampons. Do not douche.  Do not have sexual intercourse or orgasms until approved by your health care provider.  Follow your health care provider's advice about lifting, driving, and physical activities.  If you pass any tissue from your vagina, save the tissue so you can show it to your health care provider.   Only take over-the-counter  or prescription medicines as directed by your health care provider.  Do not take aspirin because it can make you bleed.   Keep all follow-up appointments as directed by your health care provider. SEEK MEDICAL CARE IF:  You have any vaginal bleeding during any part of your pregnancy.  You have cramps or labor pains. SEEK IMMEDIATE MEDICAL CARE IF:   You have severe cramps or pain in your back or belly (abdomen).  You have a fever, not controlled by medicine.  You have chills.  You have a gush of fluid from the vagina.  You pass large clots or tissue from your vagina.  Your bleeding increases.  You feel lightheaded or weak.  You pass out.  You feel less movement or no movement of the baby.  MAKE SURE YOU:  Understand these instructions.  Will watch your condition.  Will get help right away if you are not doing well or get worse. Document Released: 06/16/2002 Document Revised: 01/14/2013 Document Reviewed: 12/01/2012 [facility] Patient Information 2014 [employer].  Pelvic Rest Pelvic rest is sometimes recommended for women when:   The placenta is partially or completely covering the opening of the cervix (placenta previa).  There is bleeding between the uterine wall and the amniotic sac in the first trimester (subchorionic hemorrhage).  The cervix begins to open without labor starting (incompetent cervix, cervical insufficiency).  The labor is too early (preterm labor). HOME CARE INSTRUCTIONS  Do not have sexual intercourse, stimulation, or an orgasm.  Do not use tampons, douche, or  put anything in the vagina. °· Do not lift anything over 10 pounds (4.5 kg). °· Avoid strenuous activity or straining your pelvic muscles. °SEEK MEDICAL CARE IF:  °· You have any vaginal bleeding during pregnancy. Treat this as a potential emergency. °· You have cramping pain felt low in the stomach (stronger than menstrual cramps). °· You notice vaginal discharge (watery, mucus, or  bloody). °· You have a low, dull backache. °· There are regular contractions or uterine tightening. °SEEK IMMEDIATE MEDICAL CARE IF: °You have vaginal bleeding and have placenta previa.  °Document Released: 07/21/2010 Document Revised: 06/18/2011 Document Reviewed: 07/21/2010 °ExitCare® Patient Information ©2014 [employer]. ° °

## 2013-08-03 NOTE — MAU Provider Note (Signed)
Chief Complaint:  Abdominal Pain, Decreased Fetal Movement and Rupture of Membranes  First Provider Initiated Contact with Patient 08/03/13 2024     HPI: Brandy Beasley is a 33 y.o. [id number] at 3962w0d who presents to maternity admissions reporting passing tan mucus at lunchtime today x 1 and seven low abd cramps today. Denies LOF, urinary complaints. Posterior placenta per anatomy scan. A pos blood type. No other bleeding this pregnancy. Last IC 2 days ago. Good fetal movement.   Also C/O heartburn. Not taking anything for it.   Pregnancy Course: Uncomplicated. Hx term SVD x 2.   Past Medical History: Past Medical History  Diagnosis Date  . Medical history non-contributory     Past obstetric history: OB History  Gravida Para Term Preterm AB SAB TAB Ectopic Multiple Living  5 2 2  2 1 1   2     # Outcome Date GA Lbr Len/2nd Weight Sex Delivery Anes PTL Lv  5 CUR           4 TAB           3 SAB           2 TRM           1 TRM               Past Surgical History: Past Surgical History  Procedure Laterality Date  . Cholecystectomy       Family History: Family History  Problem Relation Age of Onset  . Hypertension Other   . Diabetes Other   . Cancer Other   . Hypertension Mother   . HIV Father   . Diabetes Maternal Grandmother   . Hypertension Maternal Grandmother   . Cancer Maternal Grandfather     Social History: History  Substance Use Topics  . Smoking status: Current Every Day Smoker -- 0.50 packs/day    Types: Cigarettes  . Smokeless tobacco: Never Used     Comment: states she is cutting back  . Alcohol Use: No    Allergies: No Known Allergies  Meds:  Prescriptions prior to admission  Medication Sig Dispense Refill  . omeprazole (PRILOSEC) 20 MG capsule Take 1 capsule (20 mg total) by mouth 2 (two) times daily before a meal.  60 capsule  11  . Prenatal Vit-Fe Fumarate-FA (PRENATAL MULTIVITAMIN) TABS tablet Take 1 tablet by mouth daily at 12 noon.         ROS: Pertinent findings in history of present illness.  Physical Exam  Blood pressure 126/65, pulse 94, temperature 98.5 F (36.9 C), temperature source Oral, resp. rate 18, height 5\' 5"  (1.651 m), weight 101.606 kg (224 lb), last menstrual period 01/12/2013, SpO2 100.00%. GENERAL: Well-developed, well-nourished female in no acute distress.  HEENT: normocephalic HEART: normal rate RESP: normal effort ABDOMEN: Soft, non-tender, gravid appropriate for gestational age. No CVAT.  EXTREMITIES: Nontender, no edema NEURO: alert and oriented SPECULUM EXAM: NEFG, physiologic discharge, no blood, cervix clean Dilation: Closed Effacement (%): Thick Exam by:: Ivonne AndrewV. Pariss Hommes CNM  FHT:  Baseline 140 , moderate variability, accelerations present, no decelerations Contractions: one painless UC   Labs: Results for orders placed during the hospital encounter of 08/03/13 (from the past 24 hour(s))  URINALYSIS, ROUTINE W REFLEX MICROSCOPIC     Status: None   Collection Time    08/03/13  8:09 PM      Result Value Ref Range   Color, Urine YELLOW  YELLOW   APPearance CLEAR  CLEAR  Specific Gravity, Urine 1.025  1.005 - 1.030   pH 6.0  5.0 - 8.0   Glucose, UA NEGATIVE  NEGATIVE mg/dL   Hgb urine dipstick NEGATIVE  NEGATIVE   Bilirubin Urine NEGATIVE  NEGATIVE   Ketones, ur NEGATIVE  NEGATIVE mg/dL   Protein, ur NEGATIVE  NEGATIVE mg/dL   Urobilinogen, UA 0.2  0.0 - 1.0 mg/dL   Nitrite NEGATIVE  NEGATIVE   Leukocytes, UA NEGATIVE  NEGATIVE  WET PREP, GENITAL     Status: Abnormal   Collection Time    08/03/13  8:15 PM      Result Value Ref Range   Yeast Wet Prep HPF POC NONE SEEN  NONE SEEN   Trich, Wet Prep NONE SEEN  NONE SEEN   Clue Cells Wet Prep HPF POC NONE SEEN  NONE SEEN   WBC, Wet Prep HPF POC FEW (*) NONE SEEN    Imaging:  No results found.  MAU Course: fFN not sent due very reassuring cervical exam and rare contractions.   Assessment: 1. Spotting complicating pregnancy in  third trimester, antepartum   A pos  Plan: Discharge home in stable condition. Preterm labor precautions and fetal kick counts. Bleeding precautions.  Pelvic rest x 1 week.      Follow-up Information   [facility]   Specialty:  Obstetrics and Gynecology   Contact information:   [address], [address] [place] [id number] [phone]      [facility] with [facility]. (As needed in emergencies)    Contact information:   [address] [id number] [place] [id number] [phone]       Medication List         famotidine 20 MG tablet  Commonly known as:  PEPCID  Take 1 tablet (20 mg total) by mouth 2 (two) times daily.     omeprazole 20 MG capsule  Commonly known as:  PRILOSEC  Take 1 capsule (20 mg total) by mouth 2 (two) times daily before a meal.     prenatal multivitamin Tabs tablet  Take 1 tablet by mouth daily at 12 noon.        Brandy Beasley, [place] 08/03/2013 9:19 PM

## 2013-08-03 NOTE — MAU Note (Signed)
Pt reports brownish mucous discharge and leaking clear fluid today. Decreased fetal movement today. Cramping in lower abd.

## 2013-08-04 LAB — GC/CHLAMYDIA PROBE AMP
CT Probe RNA: NEGATIVE
GC Probe RNA: NEGATIVE

## 2013-08-06 ENCOUNTER — Ambulatory Visit (INDEPENDENT_AMBULATORY_CARE_PROVIDER_SITE_OTHER): Payer: Medicaid Other | Admitting: Obstetrics

## 2013-08-06 ENCOUNTER — Encounter: Payer: Self-pay | Admitting: Obstetrics

## 2013-08-06 VITALS — BP 117/71 | HR 76 | Temp 98.1°F | Wt 221.0 lb

## 2013-08-06 DIAGNOSIS — Z3480 Encounter for supervision of other normal pregnancy, unspecified trimester: Secondary | ICD-10-CM

## 2013-08-06 LAB — POCT URINALYSIS DIPSTICK
Bilirubin, UA: NEGATIVE
Blood, UA: NEGATIVE
GLUCOSE UA: NEGATIVE
Ketones, UA: NEGATIVE
Leukocytes, UA: NEGATIVE
NITRITE UA: NEGATIVE
Protein, UA: NEGATIVE
Spec Grav, UA: 1.015
UROBILINOGEN UA: NEGATIVE
pH, UA: 6.5

## 2013-08-06 NOTE — Progress Notes (Signed)
Subjective:    [name] is a 33 y.o. female being seen today for her obstetrical visit. She is at 5164w3d gestation. Patient reports no complaints. Fetal movement: normal.  Problem List Items Addressed This Visit   Supervision of other normal pregnancy - Primary   Relevant Orders      POCT urinalysis dipstick (Completed)     Patient Active Problem List   Diagnosis Date Noted  . Supervision of other normal pregnancy 07/05/2013  . Tobacco use complicating pregnancy 07/05/2013  . Candidiasis of vulva and vagina 05/07/2013  . BV (bacterial vaginosis) 02/12/2013  . GERD without esophagitis 02/12/2013   Objective:    BP 117/71  Pulse 76  Temp(Src) 98.1 F (36.7 C)  Wt 221 lb (100.245 kg)  LMP 01/12/2013 FHT:  150 BPM  Uterine Size: size equals dates  Presentation: unsure     Assessment:    Pregnancy @ 4364w3d weeks   Plan:     labs reviewed, problem list updated Consent signed. GBS sent TDAP offered  Rhogam given for RH negative Pediatrician: discussed. Infant feeding: plans to breastfeed. Maternity leave: not discussed. Cigarette smoking: smokes 0.5 PPD. Orders Placed This Encounter  Procedures  . POCT urinalysis dipstick   No orders of the defined types were placed in this encounter.   Follow up in 2 Weeks.

## 2013-08-13 ENCOUNTER — Ambulatory Visit (INDEPENDENT_AMBULATORY_CARE_PROVIDER_SITE_OTHER): Payer: Medicaid Other | Admitting: Obstetrics

## 2013-08-13 ENCOUNTER — Encounter: Payer: Self-pay | Admitting: Obstetrics

## 2013-08-13 VITALS — BP 106/70 | HR 96 | Temp 98.6°F | Wt 222.0 lb

## 2013-08-13 DIAGNOSIS — Z3480 Encounter for supervision of other normal pregnancy, unspecified trimester: Secondary | ICD-10-CM

## 2013-08-13 DIAGNOSIS — O3660X0 Maternal care for excessive fetal growth, unspecified trimester, not applicable or unspecified: Secondary | ICD-10-CM

## 2013-08-13 LAB — POCT URINALYSIS DIPSTICK
Blood, UA: NEGATIVE
Glucose, UA: NEGATIVE
Ketones, UA: NEGATIVE
Leukocytes, UA: NEGATIVE
Nitrite, UA: NEGATIVE
PROTEIN UA: NEGATIVE
pH, UA: 7

## 2013-08-13 NOTE — Progress Notes (Signed)
Subjective:    Brandy [place]Sarah A Beasley is a 33 y.o. female being seen today for her obstetrical visit. She is at 1267w3d gestation. Patient reports no complaints. Fetal movement: normal.  Problem List Items Addressed This Visit   Supervision of other normal pregnancy - Primary   Relevant Orders      POCT urinalysis dipstick (Completed)    Other Visit Diagnoses   Excessive fetal growth affecting management of mother, antepartum        Relevant Orders       US OB Follow Up      Patient Active Problem List   Diagnosis Date Noted  . Supervision of other normal pregnancy 07/05/2013  . Tobacco use complicating pregnancy 07/05/2013  . Candidiasis of vulva and vagina 05/07/2013  . BV (bacterial vaginosis) 02/12/2013  . GERD without esophagitis 02/12/2013   Objective:    BP 106/70  Pulse 96  Temp(Src) 98.6 F (37 C)  Wt 222 lb (100.699 kg)  LMP 01/12/2013 FHT:  150 BPM  Uterine Size: size greater than dates  Presentation: unsure     Assessment:    Pregnancy @ 6167w3d weeks   Plan:     labs reviewed, problem list updated Consent signed. GBS sent TDAP offered  Rhogam given for RH negative Pediatrician: discussed. Infant feeding: plans to breastfeed. Maternity leave: not discussed. Cigarette smoking: smokes 0.5 PPD. Orders Placed This Encounter  Procedures  . US OB Follow Up    Standing Status: Future     Number of Occurrences:      Standing Expiration Date: 10/14/2014    Order Specific Question:  Reason for Exam (SYMPTOM  OR DIAGNOSIS REQUIRED)    Answer:  LGA    Order Specific Question:  Preferred imaging location?    Answer:  Internal  . POCT urinalysis dipstick   No orders of the defined types were placed in this encounter.   Follow up in 2 Weeks.

## 2013-08-20 ENCOUNTER — Other Ambulatory Visit: Payer: Self-pay | Admitting: Obstetrics

## 2013-08-20 ENCOUNTER — Ambulatory Visit (HOSPITAL_COMMUNITY)
Admission: RE | Admit: 2013-08-20 | Discharge: 2013-08-20 | Disposition: A | Discharge status: Home / Self Care | Payer: Medicaid Other | Source: Ambulatory Visit | Attending: Obstetrics | Admitting: Obstetrics

## 2013-08-20 DIAGNOSIS — O3660X0 Maternal care for excessive fetal growth, unspecified trimester, not applicable or unspecified: Secondary | ICD-10-CM

## 2013-08-20 DIAGNOSIS — Z3689 Encounter for other specified antenatal screening: Secondary | ICD-10-CM | POA: Insufficient documentation

## 2013-08-27 ENCOUNTER — Encounter: Payer: Self-pay | Admitting: *Deleted

## 2013-08-27 ENCOUNTER — Ambulatory Visit (INDEPENDENT_AMBULATORY_CARE_PROVIDER_SITE_OTHER): Payer: Medicaid Other | Admitting: Obstetrics

## 2013-08-27 VITALS — BP 114/72 | HR 84 | Temp 98.2°F | Wt 220.0 lb

## 2013-08-27 DIAGNOSIS — Z3480 Encounter for supervision of other normal pregnancy, unspecified trimester: Secondary | ICD-10-CM

## 2013-08-27 DIAGNOSIS — R519 Headache, unspecified: Secondary | ICD-10-CM | POA: Insufficient documentation

## 2013-08-27 DIAGNOSIS — R1110 Vomiting, unspecified: Secondary | ICD-10-CM

## 2013-08-27 DIAGNOSIS — R51 Headache: Secondary | ICD-10-CM

## 2013-08-27 DIAGNOSIS — R197 Diarrhea, unspecified: Secondary | ICD-10-CM

## 2013-08-27 LAB — POCT URINALYSIS DIPSTICK
Glucose, UA: NEGATIVE
Leukocytes, UA: NEGATIVE
Nitrite, UA: NEGATIVE
PH UA: 6
PROTEIN UA: NEGATIVE
RBC UA: NEGATIVE
Spec Grav, UA: 1.015

## 2013-08-27 MED ORDER — OXYCODONE HCL 10 MG PO TABS: 10.0000 mg | ORAL_TABLET | Freq: Four times a day (QID) | ORAL | Status: DC | PRN

## 2013-08-27 NOTE — Progress Notes (Signed)
Subjective:    Brandy [place]Sarah A Beasley is a 33 y.o. female being seen today for her obstetrical visit. She is at 6641w3d gestation. Patient reports headache, vomiting and diarrhea. Fetal movement: normal.  Problem List Items Addressed This Visit   Headache   Relevant Medications      Oxycodone HCl 10 MG TABS   Supervision of other normal pregnancy - Primary   Relevant Orders      POCT urinalysis dipstick (Completed)   Vomiting and diarrhea     Patient Active Problem List   Diagnosis Date Noted  . Vomiting and diarrhea 08/27/2013  . Headache 08/27/2013  . Supervision of other normal pregnancy 07/05/2013  . Tobacco use complicating pregnancy 07/05/2013  . Candidiasis of vulva and vagina 05/07/2013  . BV (bacterial vaginosis) 02/12/2013  . GERD without esophagitis 02/12/2013   Objective:    BP 114/72  Pulse 84  Temp(Src) 98.2 F (36.8 C)  Wt 220 lb (99.791 kg)  LMP 01/12/2013 FHT:  150 BPM  Uterine Size: size equals dates  Presentation: unsure     Assessment:    Pregnancy @ 2441w3d weeks   Vomiting and diarrhea.  Headache  Plan:   Imodium AD recommended for diarrhea Oxycodone Rx for HA    labs reviewed, problem list updated Consent signed. GBS sent TDAP offered  Rhogam given for RH negative Pediatrician: discussed. Infant feeding: plans to breastfeed. Maternity leave: discussed. Cigarette smoking: smokes 0.5 PPD. Orders Placed This Encounter  Procedures  . POCT urinalysis dipstick   Meds ordered this encounter  Medications  . Oxycodone HCl 10 MG TABS    Sig: Take 1 tablet (10 mg total) by mouth every 6 (six) hours as needed.    Dispense:  40 tablet    Refill:  0   Follow up in 2 Weeks.

## 2013-09-10 ENCOUNTER — Encounter: Payer: Self-pay | Admitting: *Deleted

## 2013-09-10 ENCOUNTER — Encounter: Payer: Self-pay | Admitting: Obstetrics

## 2013-09-10 ENCOUNTER — Ambulatory Visit (INDEPENDENT_AMBULATORY_CARE_PROVIDER_SITE_OTHER): Payer: Medicaid Other | Admitting: Obstetrics

## 2013-09-10 ENCOUNTER — Encounter: Payer: Medicaid Other | Admitting: Obstetrics

## 2013-09-10 VITALS — BP 115/70 | HR 91 | Temp 98.5°F | Wt 222.0 lb

## 2013-09-10 DIAGNOSIS — Z3480 Encounter for supervision of other normal pregnancy, unspecified trimester: Secondary | ICD-10-CM

## 2013-09-10 LAB — POCT URINALYSIS DIPSTICK
BILIRUBIN UA: NEGATIVE
Glucose, UA: NEGATIVE
KETONES UA: NEGATIVE
Leukocytes, UA: NEGATIVE
NITRITE UA: NEGATIVE
PH UA: 6
RBC UA: NEGATIVE
SPEC GRAV UA: 1.015
Urobilinogen, UA: NEGATIVE

## 2013-09-10 NOTE — Progress Notes (Signed)
Subjective:    Brandy Beasley is a 33 y.o. female being seen today for her obstetrical visit. She is at [redacted]w[redacted]d gestation. Patient reports no complaints. Fetal movement: normal.  Problem List Items Addressed This Visit   Supervision of other normal pregnancy - Primary   Relevant Orders      POCT urinalysis dipstick (Completed)     Patient Active Problem List   Diagnosis Date Noted  . Vomiting and diarrhea 08/27/2013  . Headache 08/27/2013  . Supervision of other normal pregnancy 07/05/2013  . Tobacco use complicating pregnancy 07/05/2013  . Candidiasis of vulva and vagina 05/07/2013  . BV (bacterial vaginosis) 02/12/2013  . GERD without esophagitis 02/12/2013   Objective:    BP 115/70  Pulse 91  Temp(Src) 98.5 F (36.9 C)  Wt 222 lb (100.699 kg)  LMP 01/12/2013 FHT:  150 BPM  Uterine Size: size equals dates  Presentation: unsure     Assessment:    Pregnancy @ [redacted]w[redacted]d weeks   Plan:     labs reviewed, problem list updated Consent signed. GBS sent TDAP offered  Rhogam given for RH negative Pediatrician: discussed. Infant feeding: plans to breastfeed. Maternity leave: discussed. Cigarette smoking: smokes 0.5 PPD. Orders Placed This Encounter  Procedures  . POCT urinalysis dipstick   No orders of the defined types were placed in this encounter.   Follow up in 1 Week.

## 2013-09-22 ENCOUNTER — Ambulatory Visit (INDEPENDENT_AMBULATORY_CARE_PROVIDER_SITE_OTHER): Payer: Medicaid Other | Admitting: Obstetrics

## 2013-09-22 ENCOUNTER — Encounter: Payer: Self-pay | Admitting: Obstetrics

## 2013-09-22 VITALS — BP 112/73 | HR 94 | Temp 98.0°F | Wt 226.0 lb

## 2013-09-22 DIAGNOSIS — Z3480 Encounter for supervision of other normal pregnancy, unspecified trimester: Secondary | ICD-10-CM

## 2013-09-22 LAB — POCT URINALYSIS DIPSTICK
GLUCOSE UA: NEGATIVE
KETONES UA: NEGATIVE
Leukocytes, UA: NEGATIVE
Nitrite, UA: NEGATIVE
Protein, UA: NEGATIVE
RBC UA: NEGATIVE
SPEC GRAV UA: 1.015
pH, UA: 6

## 2013-09-22 NOTE — Progress Notes (Signed)
Subjective:    Brandy [place]Sarah A Beasley is a 33 y.o. female being seen today for her obstetrical visit. She is at 8225w1d gestation. Patient reports no complaints. Fetal movement: normal.  Problem List Items Addressed This Visit   Supervision of other normal pregnancy - Primary   Relevant Orders      POCT urinalysis dipstick (Completed)      Strep B DNA probe     Patient Active Problem List   Diagnosis Date Noted  . Vomiting and diarrhea 08/27/2013  . Headache 08/27/2013  . Supervision of other normal pregnancy 07/05/2013  . Tobacco use complicating pregnancy 07/05/2013  . Candidiasis of vulva and vagina 05/07/2013  . BV (bacterial vaginosis) 02/12/2013  . GERD without esophagitis 02/12/2013   Objective:    BP 112/73  Pulse 94  Temp(Src) 98 F (36.7 C)  Wt 226 lb (102.513 kg)  LMP 01/12/2013 FHT:  150 BPM  Uterine Size: size equals dates  Presentation: unsure     Assessment:    Pregnancy @ 5725w1d weeks   Plan:     labs reviewed, problem list updated Consent signed. GBS sent TDAP offered  Rhogam given for RH negative Pediatrician: discussed. Infant feeding: plans to breastfeed. Maternity leave: discussed. Cigarette smoking: smokes 0.5 PPD. Orders Placed This Encounter  Procedures  . Strep B DNA probe  . POCT urinalysis dipstick   No orders of the defined types were placed in this encounter.   Follow up in 1 Week.

## 2013-09-22 NOTE — Progress Notes (Signed)
Patient seems to be doing really well with her swelling and elevation. She is very active. Patient does state she has a lot of pressure.

## 2013-09-24 LAB — STREP B DNA PROBE: GBSP: NEGATIVE

## 2013-09-29 ENCOUNTER — Ambulatory Visit (INDEPENDENT_AMBULATORY_CARE_PROVIDER_SITE_OTHER): Payer: Medicaid Other | Admitting: Obstetrics

## 2013-09-29 ENCOUNTER — Encounter: Payer: Self-pay | Admitting: Obstetrics

## 2013-09-29 VITALS — BP 112/73 | HR 93 | Temp 98.1°F | Wt 221.0 lb

## 2013-09-29 DIAGNOSIS — Z3480 Encounter for supervision of other normal pregnancy, unspecified trimester: Secondary | ICD-10-CM

## 2013-09-29 DIAGNOSIS — Z3483 Encounter for supervision of other normal pregnancy, third trimester: Secondary | ICD-10-CM

## 2013-09-29 LAB — POCT URINALYSIS DIPSTICK
BILIRUBIN UA: NEGATIVE
Blood, UA: NEGATIVE
GLUCOSE UA: NEGATIVE
KETONES UA: NEGATIVE
LEUKOCYTES UA: NEGATIVE
Nitrite, UA: NEGATIVE
Spec Grav, UA: 1.02
Urobilinogen, UA: NEGATIVE
pH, UA: 5

## 2013-09-29 NOTE — Progress Notes (Signed)
Subjective:    Brandy Beasley is a 33 y.o. female being seen today for her obstetrical visit. She is at 9440w1d gestation. Patient reports no complaints. Fetal movement: normal.  Problem List Items Addressed This Visit   Supervision of other normal pregnancy - Primary   Relevant Orders      POCT urinalysis dipstick (Completed)     Patient Active Problem List   Diagnosis Date Noted  . Vomiting and diarrhea 08/27/2013  . Headache 08/27/2013  . Supervision of other normal pregnancy 07/05/2013  . Tobacco use complicating pregnancy 07/05/2013  . Candidiasis of vulva and vagina 05/07/2013  . BV (bacterial vaginosis) 02/12/2013  . GERD without esophagitis 02/12/2013    Objective:    BP 112/73  Pulse 93  Temp(Src) 98.1 F (36.7 C)  Wt 221 lb (100.245 kg)  LMP 01/12/2013 FHT: 150 BPM  Uterine Size: size equals dates  Presentations: cephalic  Pelvic Exam: Deferred    Assessment:    Pregnancy @ 7140w1d weeks   Plan:   Plans for delivery: Vaginal anticipated; labs reviewed; problem list updated Counseling: Consent signed. Infant feeding: plans to breastfeed. Cigarette smoking: smokes 0.5 PPD. L&D discussion: symptoms of labor, discussed when to call, discussed what number to call, anesthetic/analgesic options reviewed and delivering clinician:  plans Physician. Postpartum supports and preparation: circumcision discussed and contraception plans discussed.  Follow up in 1 Week.

## 2013-10-05 ENCOUNTER — Encounter: Payer: Self-pay | Admitting: Obstetrics

## 2013-10-05 ENCOUNTER — Ambulatory Visit (INDEPENDENT_AMBULATORY_CARE_PROVIDER_SITE_OTHER): Payer: Medicaid Other | Admitting: Obstetrics

## 2013-10-05 VITALS — BP 101/68 | HR 88 | Wt 227.0 lb

## 2013-10-05 DIAGNOSIS — Z3483 Encounter for supervision of other normal pregnancy, third trimester: Secondary | ICD-10-CM

## 2013-10-05 DIAGNOSIS — Z3480 Encounter for supervision of other normal pregnancy, unspecified trimester: Secondary | ICD-10-CM

## 2013-10-05 NOTE — Progress Notes (Signed)
Subjective:    Brandy Beasley is a 33 y.o. female being seen today for her obstetrical visit. She is at 6163w0d gestation. Patient reports no complaints. Fetal movement: normal.  Problem List Items Addressed This Visit   Supervision of other normal pregnancy - Primary   Relevant Orders      POCT urinalysis dipstick     Patient Active Problem List   Diagnosis Date Noted  . Vomiting and diarrhea 08/27/2013  . Headache 08/27/2013  . Supervision of other normal pregnancy 07/05/2013  . Tobacco use complicating pregnancy 07/05/2013  . Candidiasis of vulva and vagina 05/07/2013  . BV (bacterial vaginosis) 02/12/2013  . GERD without esophagitis 02/12/2013    Objective:    BP 101/68  Pulse 88  Wt 227 lb (102.967 kg)  LMP 01/12/2013 FHT: 150 BPM  Uterine Size: size equals dates  Presentations: cephalic  Pelvic Exam: Deferred   Assessment:    Pregnancy @ 2463w0d weeks   Plan:   Plans for delivery: Vaginal anticipated; labs reviewed; problem list updated Counseling: Consent signed. Infant feeding: plans to breastfeed. Cigarette smoking: smokes 0.5 PPD. L&D discussion: symptoms of labor, discussed when to call, discussed what number to call, anesthetic/analgesic options reviewed and delivering clinician:  plans Physician. Postpartum supports and preparation: circumcision discussed and contraception plans discussed.  Follow up in 1 Week.

## 2013-10-06 LAB — POCT URINALYSIS DIPSTICK
BILIRUBIN UA: NEGATIVE
Blood, UA: NEGATIVE
GLUCOSE UA: NEGATIVE
Ketones, UA: NEGATIVE
Leukocytes, UA: NEGATIVE
NITRITE UA: NEGATIVE
Protein, UA: NEGATIVE
Spec Grav, UA: 1.015
UROBILINOGEN UA: NEGATIVE
pH, UA: 6

## 2013-10-07 HISTORY — PX: TUBAL LIGATION: SHX77

## 2013-10-12 ENCOUNTER — Ambulatory Visit (INDEPENDENT_AMBULATORY_CARE_PROVIDER_SITE_OTHER): Payer: Medicaid Other | Admitting: Obstetrics

## 2013-10-12 VITALS — BP 106/68 | HR 98 | Temp 98.4°F | Wt 226.0 lb

## 2013-10-12 DIAGNOSIS — Z3483 Encounter for supervision of other normal pregnancy, third trimester: Secondary | ICD-10-CM

## 2013-10-12 DIAGNOSIS — Z3480 Encounter for supervision of other normal pregnancy, unspecified trimester: Secondary | ICD-10-CM

## 2013-10-12 LAB — POCT URINALYSIS DIPSTICK
Bilirubin, UA: NEGATIVE
GLUCOSE UA: NEGATIVE
Ketones, UA: NEGATIVE
Leukocytes, UA: NEGATIVE
NITRITE UA: NEGATIVE
PROTEIN UA: NEGATIVE
RBC UA: NEGATIVE
SPEC GRAV UA: 1.015
UROBILINOGEN UA: NEGATIVE
pH, UA: 6

## 2013-10-13 ENCOUNTER — Encounter: Payer: Self-pay | Admitting: Obstetrics

## 2013-10-13 NOTE — Progress Notes (Signed)
Subjective:    Brandy Beasley is a 33 y.o. female being seen today for her obstetrical visit. She is at 2679w1d gestation. Patient reports backache and occasional contractions. Fetal movement: normal.  Problem List Items Addressed This Visit   Supervision of other normal pregnancy - Primary   Relevant Orders      POCT urinalysis dipstick (Completed)     Patient Active Problem List   Diagnosis Date Noted  . Vomiting and diarrhea 08/27/2013  . Headache 08/27/2013  . Supervision of other normal pregnancy 07/05/2013  . Tobacco use complicating pregnancy 07/05/2013  . Candidiasis of vulva and vagina 05/07/2013  . BV (bacterial vaginosis) 02/12/2013  . GERD without esophagitis 02/12/2013    Objective:    BP 106/68  Pulse 98  Temp(Src) 98.4 F (36.9 C)  Wt 226 lb (102.513 kg)  LMP 01/12/2013 FHT: 150 BPM  Uterine Size: size greater than dates  Presentations: cephalic  Pelvic Exam: Deferred    Assessment:    Pregnancy @ 8079w1d weeks   Plan:   Plans for delivery: Vaginal anticipated; labs reviewed; problem list updated Counseling: Consent signed. Infant feeding: plans to breastfeed. Cigarette smoking: smokes 0.5 PPD. L&D discussion: symptoms of labor, discussed when to call, discussed what number to call, anesthetic/analgesic options reviewed and delivering clinician:  plans Physician. Postpartum supports and preparation: circumcision discussed and contraception plans discussed.  Follow up in 1 Week.

## 2013-10-19 ENCOUNTER — Telehealth: Payer: Self-pay | Admitting: *Deleted

## 2013-10-19 NOTE — Telephone Encounter (Signed)
Patient left message that she had not felt her baby move since Saturday. 5:30- Lm on VM need to go to MAU now if no movement. Dr Clearance CootsHarper notified regarding call.

## 2013-10-20 ENCOUNTER — Encounter (HOSPITAL_COMMUNITY): Payer: Self-pay

## 2013-10-20 ENCOUNTER — Inpatient Hospital Stay (HOSPITAL_COMMUNITY)
Admission: AD | Admit: 2013-10-20 | Discharge: 2013-10-21 | DRG: 775 | Disposition: A | Discharge status: Home / Self Care | Payer: Medicaid Other | Source: Ambulatory Visit | Attending: Obstetrics | Admitting: Obstetrics

## 2013-10-20 ENCOUNTER — Encounter (HOSPITAL_COMMUNITY): Payer: Medicaid Other | Admitting: Anesthesiology

## 2013-10-20 ENCOUNTER — Inpatient Hospital Stay (HOSPITAL_COMMUNITY): Payer: Medicaid Other | Admitting: Anesthesiology

## 2013-10-20 ENCOUNTER — Inpatient Hospital Stay (HOSPITAL_COMMUNITY): Payer: Medicaid Other

## 2013-10-20 DIAGNOSIS — O368190 Decreased fetal movements, unspecified trimester, not applicable or unspecified: Principal | ICD-10-CM | POA: Diagnosis present

## 2013-10-20 DIAGNOSIS — Z833 Family history of diabetes mellitus: Secondary | ICD-10-CM | POA: Diagnosis not present

## 2013-10-20 DIAGNOSIS — O99334 Smoking (tobacco) complicating childbirth: Secondary | ICD-10-CM | POA: Diagnosis present

## 2013-10-20 DIAGNOSIS — Z3483 Encounter for supervision of other normal pregnancy, third trimester: Secondary | ICD-10-CM

## 2013-10-20 DIAGNOSIS — O99333 Smoking (tobacco) complicating pregnancy, third trimester: Secondary | ICD-10-CM

## 2013-10-20 LAB — CBC
HCT: 34.9 % — ABNORMAL LOW (ref 36.0–46.0)
Hemoglobin: 11.7 g/dL — ABNORMAL LOW (ref 12.0–15.0)
MCH: 29.7 pg (ref 26.0–34.0)
MCHC: 33.5 g/dL (ref 30.0–36.0)
MCV: 88.6 fL (ref 78.0–100.0)
Platelets: 204 10*3/uL (ref 150–400)
RBC: 3.94 MIL/uL (ref 3.87–5.11)
RDW: 13.9 % (ref 11.5–15.5)
WBC: 17.4 10*3/uL — ABNORMAL HIGH (ref 4.0–10.5)

## 2013-10-20 LAB — RPR

## 2013-10-20 LAB — TYPE AND SCREEN
ABO/RH(D): A POS
Antibody Screen: NEGATIVE

## 2013-10-20 LAB — ABO/RH: ABO/RH(D): A POS

## 2013-10-20 MED ORDER — LANOLIN HYDROUS EX OINT: TOPICAL_OINTMENT | CUTANEOUS | Status: DC | PRN

## 2013-10-20 MED ORDER — BUTORPHANOL TARTRATE 1 MG/ML IJ SOLN
1.0000 mg | INTRAMUSCULAR | Status: DC | PRN
  Administered 2013-10-20: 1 mg via INTRAVENOUS
  Filled 2013-10-20: qty 1

## 2013-10-20 MED ORDER — OXYTOCIN BOLUS FROM INFUSION
500.0000 mL | INTRAVENOUS | Status: DC
  Administered 2013-10-20: 500 mL via INTRAVENOUS

## 2013-10-20 MED ORDER — FLEET ENEMA 7-19 GM/118ML RE ENEM: 1.0000 | ENEMA | Freq: Once | RECTAL | Status: DC

## 2013-10-20 MED ORDER — LACTATED RINGERS IV SOLN: 500.0000 mL | Freq: Once | INTRAVENOUS | Status: DC

## 2013-10-20 MED ORDER — PHENYLEPHRINE 40 MCG/ML (10ML) SYRINGE FOR IV PUSH (FOR BLOOD PRESSURE SUPPORT)
80.0000 ug | PREFILLED_SYRINGE | INTRAVENOUS | Status: DC | PRN
  Filled 2013-10-20: qty 2

## 2013-10-20 MED ORDER — ACETAMINOPHEN 325 MG PO TABS
650.0000 mg | ORAL_TABLET | ORAL | Status: DC | PRN
Start: 2013-10-20 — End: 2013-10-20

## 2013-10-20 MED ORDER — OXYTOCIN 40 UNITS IN LACTATED RINGERS INFUSION - SIMPLE MED
62.5000 mL/h | INTRAVENOUS | Status: DC
Start: 2013-10-20 — End: 2013-10-20
  Filled 2013-10-20: qty 3000

## 2013-10-20 MED ORDER — PNEUMOCOCCAL VAC POLYVALENT 25 MCG/0.5ML IJ INJ
0.5000 mL | INJECTION | INTRAMUSCULAR | Status: AC
  Administered 2013-10-21: 0.5 mL via INTRAMUSCULAR
  Filled 2013-10-20: qty 0.5

## 2013-10-20 MED ORDER — TETANUS-DIPHTH-ACELL PERTUSSIS 5-2.5-18.5 LF-MCG/0.5 IM SUSP
0.5000 mL | Freq: Once | INTRAMUSCULAR | Status: AC
  Administered 2013-10-20: 0.5 mL via INTRAMUSCULAR
  Filled 2013-10-20: qty 0.5

## 2013-10-20 MED ORDER — OXYCODONE-ACETAMINOPHEN 5-325 MG PO TABS
1.0000 | ORAL_TABLET | ORAL | Status: DC | PRN
  Administered 2013-10-20 – 2013-10-21 (×3): 1 via ORAL
  Filled 2013-10-20 (×3): qty 1

## 2013-10-20 MED ORDER — ONDANSETRON HCL 4 MG PO TABS: 4.0000 mg | ORAL_TABLET | ORAL | Status: DC | PRN

## 2013-10-20 MED ORDER — BENZOCAINE-MENTHOL 20-0.5 % EX AERO
1.0000 "application " | INHALATION_SPRAY | CUTANEOUS | Status: DC | PRN
  Administered 2013-10-20: 1 via TOPICAL
  Filled 2013-10-20: qty 56

## 2013-10-20 MED ORDER — WITCH HAZEL-GLYCERIN EX PADS: 1.0000 "application " | MEDICATED_PAD | CUTANEOUS | Status: DC | PRN

## 2013-10-20 MED ORDER — ZOLPIDEM TARTRATE 5 MG PO TABS: 5.0000 mg | ORAL_TABLET | Freq: Every evening | ORAL | Status: DC | PRN

## 2013-10-20 MED ORDER — PHENYLEPHRINE 40 MCG/ML (10ML) SYRINGE FOR IV PUSH (FOR BLOOD PRESSURE SUPPORT)
80.0000 ug | PREFILLED_SYRINGE | INTRAVENOUS | Status: DC | PRN
  Filled 2013-10-20: qty 10
  Filled 2013-10-20: qty 2

## 2013-10-20 MED ORDER — SIMETHICONE 80 MG PO CHEW
80.0000 mg | CHEWABLE_TABLET | ORAL | Status: DC | PRN
  Administered 2013-10-20: 80 mg via ORAL
  Filled 2013-10-20: qty 1

## 2013-10-20 MED ORDER — FENTANYL 2.5 MCG/ML BUPIVACAINE 1/10 % EPIDURAL INFUSION (WH - ANES)
14.0000 mL/h | INTRAMUSCULAR | Status: DC | PRN
  Filled 2013-10-20: qty 125

## 2013-10-20 MED ORDER — EPHEDRINE 5 MG/ML INJ
10.0000 mg | INTRAVENOUS | Status: DC | PRN
  Filled 2013-10-20: qty 2

## 2013-10-20 MED ORDER — FENTANYL 2.5 MCG/ML BUPIVACAINE 1/10 % EPIDURAL INFUSION (WH - ANES): 14.0000 mL/h | INTRAMUSCULAR | Status: DC | PRN

## 2013-10-20 MED ORDER — OXYCODONE-ACETAMINOPHEN 5-325 MG PO TABS: 1.0000 | ORAL_TABLET | ORAL | Status: DC | PRN

## 2013-10-20 MED ORDER — PRENATAL MULTIVITAMIN CH
1.0000 | ORAL_TABLET | Freq: Every day | ORAL | Status: DC
  Administered 2013-10-20 – 2013-10-21 (×2): 1 via ORAL
  Filled 2013-10-20 (×2): qty 1

## 2013-10-20 MED ORDER — ONDANSETRON HCL 4 MG/2ML IJ SOLN: 4.0000 mg | Freq: Four times a day (QID) | INTRAMUSCULAR | Status: DC | PRN

## 2013-10-20 MED ORDER — LACTATED RINGERS IV SOLN: 500.0000 mL | INTRAVENOUS | Status: DC | PRN

## 2013-10-20 MED ORDER — IBUPROFEN 600 MG PO TABS
600.0000 mg | ORAL_TABLET | Freq: Four times a day (QID) | ORAL | Status: DC | PRN
Start: 2013-10-20 — End: 2013-10-20
  Administered 2013-10-20: 600 mg via ORAL
  Filled 2013-10-20: qty 1

## 2013-10-20 MED ORDER — DIPHENHYDRAMINE HCL 50 MG/ML IJ SOLN: 12.5000 mg | INTRAMUSCULAR | Status: DC | PRN

## 2013-10-20 MED ORDER — LIDOCAINE HCL (PF) 1 % IJ SOLN
30.0000 mL | INTRAMUSCULAR | Status: DC | PRN
  Filled 2013-10-20: qty 30

## 2013-10-20 MED ORDER — ONDANSETRON HCL 4 MG/2ML IJ SOLN: 4.0000 mg | INTRAMUSCULAR | Status: DC | PRN

## 2013-10-20 MED ORDER — IBUPROFEN 600 MG PO TABS
600.0000 mg | ORAL_TABLET | Freq: Four times a day (QID) | ORAL | Status: DC
  Administered 2013-10-20 – 2013-10-21 (×4): 600 mg via ORAL
  Filled 2013-10-20 (×4): qty 1

## 2013-10-20 MED ORDER — SENNOSIDES-DOCUSATE SODIUM 8.6-50 MG PO TABS
2.0000 | ORAL_TABLET | ORAL | Status: DC
  Administered 2013-10-21: 2 via ORAL
  Filled 2013-10-20: qty 2

## 2013-10-20 MED ORDER — CITRIC ACID-SODIUM CITRATE 334-500 MG/5ML PO SOLN: 30.0000 mL | ORAL | Status: DC | PRN

## 2013-10-20 MED ORDER — DIBUCAINE 1 % RE OINT: 1.0000 "application " | TOPICAL_OINTMENT | RECTAL | Status: DC | PRN

## 2013-10-20 MED ORDER — LACTATED RINGERS IV SOLN
INTRAVENOUS | Status: DC
  Administered 2013-10-20: 07:00:00 via INTRAVENOUS

## 2013-10-20 NOTE — Anesthesia Preprocedure Evaluation (Deleted)
Anesthesia Evaluation  Patient identified by MRN, date of birth, ID band Patient awake    Reviewed: Allergy & Precautions, H&P , Patient's Chart, lab work & pertinent test results  Airway Mallampati: II TM Distance: >3 FB Neck ROM: full    Dental  (+) Teeth Intact   Pulmonary Current Smoker,  breath sounds clear to auscultation        Cardiovascular Rhythm:regular Rate:Normal     Neuro/Psych    GI/Hepatic GERD-  Medicated,  Endo/Other  Morbid obesity  Renal/GU      Musculoskeletal   Abdominal   Peds  Hematology   Anesthesia Other Findings       Reproductive/Obstetrics (+) Pregnancy                           Anesthesia Physical Anesthesia Plan  ASA: III  Anesthesia Plan: Epidural   Post-op Pain Management:    Induction:   Airway Management Planned:   Additional Equipment:   Intra-op Plan:   Post-operative Plan:   Informed Consent: I have reviewed the patients History and Physical, chart, labs and discussed the procedure including the risks, benefits and alternatives for the proposed anesthesia with the patient or authorized representative who has indicated his/her understanding and acceptance.   Dental Advisory Given  Plan Discussed with:   Anesthesia Plan Comments: (Labs checked- platelets confirmed with RN in room. Fetal heart tracing, per RN, reported to be stable enough for sitting procedure. Discussed epidural, and patient consents to the procedure:  included risk of possible headache,backache, failed block, allergic reaction, and nerve injury. This patient was asked if she had any questions or concerns before the procedure started.)        Anesthesia Quick Evaluation

## 2013-10-20 NOTE — H&P (Signed)
Brandy Beasley is a 33 y.o. female presenting for Labor. Maternal Medical History:  Reason for admission: Contractions.   Contractions: Onset was 3-5 hours ago.   Frequency: regular.   Duration is approximately 3 minutes.   Perceived severity is moderate.    Fetal activity: Perceived fetal activity is normal.   Last perceived fetal movement was within the past hour.    Prenatal complications: No bleeding.   Prenatal Complications - Diabetes: none.    OB History   Grav Para Term Preterm Abortions TAB SAB Ect Mult Living   5 2 2  2 1 1   2      Past Medical History  Diagnosis Date  . Medical history non-contributory    Past Surgical History  Procedure Laterality Date  . Cholecystectomy     Family History: family history includes Cancer in her maternal grandfather and other; Diabetes in her maternal grandmother and other; HIV in her father; Hypertension in her maternal grandmother, mother, and other. Social History:  reports that she has been smoking Cigarettes.  She has been smoking about 0.25 packs per day. She has never used smokeless tobacco. She reports that she uses illicit drugs (Marijuana). She reports that she does not drink alcohol.   Prenatal Transfer Tool  Maternal Diabetes: No Genetic Screening: Normal Maternal Ultrasounds/Referrals: Normal Fetal Ultrasounds or other Referrals:  None Maternal Substance Abuse:  No Significant Maternal Medications:  None Significant Maternal Lab Results:  None Other Comments:  None  Review of Systems  Constitutional: Negative.   HENT: Negative.   Eyes: Negative.   Respiratory: Negative.   Cardiovascular: Negative.   Gastrointestinal: Positive for abdominal pain. Negative for constipation.  Genitourinary: Negative.   Musculoskeletal: Negative.   Skin: Negative.   Neurological: Negative.   Endo/Heme/Allergies: Negative.   Psychiatric/Behavioral: Negative.    Filed Vitals:   10/20/13 0922  BP: 104/57  Pulse: 59   Temp:   Resp:      Dilation: 6.5 Effacement (%): 100 Station: -2 Exam by:: Donnamarie Poag, RN; Camelia Eng, RN Blood pressure 116/84, pulse 58, temperature 97.6 F (36.4 C), temperature source Oral, resp. rate 22, height 5\' 5"  (1.651 m), weight 229 lb (103.874 kg), last menstrual period 01/12/2013, SpO2 92.00%. Maternal Exam:  Uterine Assessment: Contraction strength is moderate.  Contraction duration is 3 minutes. Contraction frequency is regular.   Abdomen: Patient reports no abdominal tenderness. Fundal height is 40.   Estimated fetal weight is 3200.   Fetal presentation: vertex  Introitus: Normal vulva. Normal vagina.  Ferning test: not done.  Nitrazine test: not done. Amniotic fluid character: not assessed.  Pelvis: adequate for delivery.      Fetal Exam Fetal Monitor Review: Mode: ultrasound.   Baseline rate: 125.  Variability: moderate (6-25 bpm).   Pattern: early decelerations.    Fetal State Assessment: Category I - tracings are normal.     Physical Exam  Vitals reviewed. Constitutional: She is oriented to person, place, and time. She appears well-developed and well-nourished.  HENT:  Head: Normocephalic.  Eyes: Pupils are equal, round, and reactive to light.  Neck: Normal range of motion. Neck supple.  Cardiovascular: Normal rate, regular rhythm and normal heart sounds.   Respiratory: Effort normal and breath sounds normal.  GI: Soft. Bowel sounds are normal.  Genitourinary: Vagina normal and uterus normal.  Musculoskeletal: Normal range of motion.  Neurological: She is alert and oriented to person, place, and time.  Skin: Skin is warm and dry.  Psychiatric: She has  a normal mood and affect. Her behavior is normal. Judgment and thought content normal.    Prenatal labs: ABO, Rh: --/--/A POS (07/14 [id number]) Antibody: PENDING (07/14 [id number]) Rubella: 1.55 (12/18 1041) RPR: NON REAC (03/26 1342)  HBsAg: NEGATIVE (12/18 1041)  HIV: NON REACTIVE (03/26 1342)  GBS:  NEGATIVE (06/16 1325)   Assessment/Plan: Active Labor Dated by L, 20 wk US Anticipate NSVD Consult PRN AROM and anticipate progress (Patient declines CLE at this time).  Category 1 FHR  Vineeth Fell 10/20/2013, 8:29 AM

## 2013-10-20 NOTE — Progress Notes (Signed)
Brandy Beasley is a 33 y.o. [id number] at 4237w1d by LMP admitted for active labor  Subjective:   Objective: BP 104/57  Pulse 59  Temp(Src) 97.6 F (36.4 C) (Oral)  Resp 18  Ht 5\' 5"  (1.651 m)  Wt 229 lb (103.874 kg)  BMI 38.11 kg/m2  SpO2 92%  LMP 01/12/2013      FHT:  FHR: 120-130 bpm, variability: moderate,  accelerations:  Present,  decelerations:  Present variable UC:   regular, every 3-4 minutes SVE:   Dilation: 6.5 Effacement (%): 100 Station: -1 Exam by:: Sherian ReinA. Wren, CNM  Labs: Lab Results  Component Value Date   WBC 17.4* 10/20/2013   HGB 11.7* 10/20/2013   HCT 34.9* 10/20/2013   MCV 88.6 10/20/2013   PLT 204 10/20/2013    Assessment / Plan: Spontaneous labor, progressing normally  Labor: Progressing normally Preeclampsia:  n/a Fetal Wellbeing:  Category I Pain Control:  Epidural I/D:  n/a Anticipated MOD:  NSVD  Rhett Mutschler A 10/20/2013, 9:23 AM

## 2013-10-20 NOTE — H&P (Signed)
Brandy SacramentoSarah A Fjeld is a 33 y.o. female presenting for decreased FM and UC's. Maternal Medical History:  Reason for admission: Contractions.   Fetal activity: Perceived fetal activity is decreased.   Last perceived fetal movement was within the past 24 hours.    Prenatal complications: no prenatal complications Prenatal Complications - Diabetes: Beasley.    OB History   Grav Para Term Preterm Abortions TAB SAB Ect Mult Living   5 2 2  2 1 1   2      Past Medical History  Diagnosis Date  . Medical history non-contributory    Past Surgical History  Procedure Laterality Date  . Cholecystectomy     Family History: family history includes Cancer in her maternal grandfather and other; Diabetes in her maternal grandmother and other; HIV in her father; Hypertension in her maternal grandmother, mother, and other. Social History:  reports that she has been smoking Cigarettes.  She has been smoking about 0.25 packs per day. She has never used smokeless tobacco. She reports that she uses illicit drugs (Marijuana). She reports that she does not drink alcohol.   Prenatal Transfer Tool  Maternal Diabetes: No Genetic Screening: Normal Maternal Ultrasounds/Referrals: Normal Fetal Ultrasounds or other Referrals:  Beasley Maternal Substance Abuse:  No Significant Maternal Medications:  Beasley Significant Maternal Lab Results:  Lab values include: Group B Strep negative Other Comments:  Beasley  Review of Systems  All other systems reviewed and are negative.   Dilation: 6.5 Effacement (%): 100 Station: -1 Exam by:: A. Wren, CNM Blood pressure 123/64, pulse 54, temperature 97.6 F (36.4 C), temperature source Oral, resp. rate 18, height 5\' 5"  (1.651 m), weight 229 lb (103.874 kg), last menstrual period 01/12/2013, SpO2 92.00%. Maternal Exam:  Uterine Assessment: Contraction strength is moderate.  Abdomen: Patient reports no abdominal tenderness. Fetal presentation: vertex  Pelvis: adequate for  delivery.   Cervix: Cervix evaluated by digital exam.     Physical Exam  Nursing note and vitals reviewed. Constitutional: She is oriented to person, place, and time. She appears well-developed and well-nourished.  HENT:  Head: Normocephalic and atraumatic.  Eyes: Conjunctivae are normal. Pupils are equal, round, and reactive to light.  Neck: Normal range of motion. Neck supple.  Cardiovascular: Normal rate and regular rhythm.   Respiratory: Effort normal.  GI: Soft.  Musculoskeletal: Normal range of motion.  Neurological: She is alert and oriented to person, place, and time.  Skin: Skin is warm and dry.  Psychiatric: She has a normal mood and affect. Her behavior is normal. Judgment and thought content normal.    Prenatal labs: ABO, Rh: --/--/A POS (07/14 [id number]) Antibody: PENDING (07/14 [id number]) Rubella: 1.55 (12/18 1041) RPR: NON REAC (03/26 1342)  HBsAg: NEGATIVE (12/18 1041)  HIV: NON REACTIVE (03/26 1342)  GBS: NEGATIVE (06/16 1325)   Assessment/Plan: 40 weeks.  Active labor.  Admit.   Omolara Carol A 10/20/2013, 9:22 AM

## 2013-10-20 NOTE — MAU Provider Note (Signed)
  History     CSN: [id number]  Arrival date and time: 10/20/13 0351   None     Chief Complaint  Patient presents with  . Decreased Fetal Movement  . Labor Eval   HPI  Brandy Beasley is a 33 y.o. [phone] at 7973w1d who presents today with decreased fetal movement. She has not felt the baby move in over 24 hours. She has been having contractions. She denies any bleeding.   Past Medical History  Diagnosis Date  . Medical history non-contributory     Past Surgical History  Procedure Laterality Date  . Cholecystectomy      Family History  Problem Relation Age of Onset  . Hypertension Other   . Diabetes Other   . Cancer Other   . Hypertension Mother   . HIV Father   . Diabetes Maternal Grandmother   . Hypertension Maternal Grandmother   . Cancer Maternal Grandfather     History  Substance Use Topics  . Smoking status: Current Every Day Smoker -- 0.50 packs/day    Types: Cigarettes  . Smokeless tobacco: Never Used     Comment: states she is cutting back  . Alcohol Use: No    Allergies: No Known Allergies  Prescriptions prior to admission  Medication Sig Dispense Refill  . EVENING PRIMROSE OIL PO Take by mouth.      . famotidine (PEPCID) 20 MG tablet Take 1 tablet (20 mg total) by mouth 2 (two) times daily.  60 tablet  1  . omeprazole (PRILOSEC) 20 MG capsule Take 1 capsule (20 mg total) by mouth 2 (two) times daily before a meal.  60 capsule  11  . Prenatal Vit-Fe Fumarate-FA (PRENATAL MULTIVITAMIN) TABS tablet Take 1 tablet by mouth daily at 12 noon.        ROS Physical Exam   Blood pressure 109/65, pulse 72, resp. rate 18, height 5\' 5"  (1.651 m), weight 103.874 kg (229 lb), last menstrual period 01/12/2013, SpO2 100.00%.  Physical Exam  Nursing note and vitals reviewed. Constitutional: She is oriented to person, place, and time. She appears well-developed and well-nourished. No distress.  Cardiovascular: Normal rate.   Respiratory: Effort normal.  GI:  Soft. There is no tenderness.  Neurological: She is alert and oriented to person, place, and time.  Skin: Skin is warm and dry.  Psychiatric: She has a normal mood and affect.   FHT: 140, minimal with 10x10s and no decels Toco: q 3-4 min contractions US: BPP 8/8 RN checked the patient, and over a period of about an hour she has made cervical change. Patient states that contractions are increasing as well. Dr. Gaynell FaceMarshall notified, will admit to labor and delivery   MAU Course  Procedures   Assessment and Plan  Decreased fetal movement Active labor Admit to labor and delivery   Tawnya CrookHogan, Heather Donovan 10/20/2013, 6:31 AM

## 2013-10-20 NOTE — MAU Note (Signed)
Pt reports decreased fetal movement for the last 2-3 days, has not felt movement at all x last 24 hours. Contractions.

## 2013-10-21 ENCOUNTER — Encounter: Payer: Medicaid Other | Admitting: Obstetrics

## 2013-10-21 LAB — CBC
HCT: 28.9 % — ABNORMAL LOW (ref 36.0–46.0)
HEMOGLOBIN: 9.6 g/dL — AB (ref 12.0–15.0)
MCH: 29.6 pg (ref 26.0–34.0)
MCHC: 33.2 g/dL (ref 30.0–36.0)
MCV: 89.2 fL (ref 78.0–100.0)
Platelets: 173 10*3/uL (ref 150–400)
RBC: 3.24 MIL/uL — AB (ref 3.87–5.11)
RDW: 13.9 % (ref 11.5–15.5)
WBC: 16.4 10*3/uL — ABNORMAL HIGH (ref 4.0–10.5)

## 2013-10-21 MED ORDER — OXYCODONE-ACETAMINOPHEN 5-325 MG PO TABS: 1.0000 | ORAL_TABLET | ORAL | Status: DC | PRN

## 2013-10-21 MED ORDER — IBUPROFEN 600 MG PO TABS: 600.0000 mg | ORAL_TABLET | Freq: Four times a day (QID) | ORAL | Status: DC | PRN

## 2013-10-21 NOTE — Discharge Summary (Signed)
Obstetric Discharge Summary Reason for Admission: onset of labor Prenatal Procedures: ultrasound Intrapartum Procedures: spontaneous vaginal delivery Postpartum Procedures: none Complications-Operative and Postpartum: none Hemoglobin  Date Value Ref Range Status  10/21/2013 9.6* 12.0 - 15.0 g/dL Final     DELTA CHECK NOTED     REPEATED TO VERIFY     HCT  Date Value Ref Range Status  10/21/2013 28.9* 36.0 - 46.0 % Final    Physical Exam:  General: alert and no distress Lochia: appropriate Uterine Fundus: firm Incision: none DVT Evaluation: No evidence of DVT seen on physical exam.  Discharge Diagnoses: Term Pregnancy-delivered  Discharge Information: Date: 10/21/2013 Activity: pelvic rest Diet: routine Medications: PNV, Ibuprofen and Percocet Condition: stable Instructions: refer to practice specific booklet Discharge to: home Follow-up Information   Follow up with Brandy Beasley A, MD In 2 weeks.   Specialty:  Obstetrics and Gynecology   Contact information:   [address] [place] [address] [phone]       Newborn Data: Live born female  Birth Weight: 7 lb 12.2 oz (3520 g) APGAR: 9, 9  Home with mother.  Brandy Beasley A 10/21/2013, 8:48 AM

## 2013-10-21 NOTE — Progress Notes (Signed)
Ur chart review completed.  

## 2013-10-21 NOTE — Progress Notes (Signed)
Post Partum Day 1 Subjective: no complaints  Objective: Blood pressure 109/53, pulse 53, temperature 98.2 F (36.8 C), temperature source Oral, resp. rate 20, height 5\' 5"  (1.651 m), weight 229 lb (103.874 kg), last menstrual period 01/12/2013, SpO2 100.00%, unknown if currently breastfeeding.  Physical Exam:  General: alert and no distress Lochia: appropriate Uterine Fundus: firm Incision: none DVT Evaluation: No evidence of DVT seen on physical exam.   Recent Labs  10/20/13 0637 10/21/13 0547  HGB 11.7* 9.6*  HCT 34.9* 28.9*    Assessment/Plan: Doing well.  Discharge home.   LOS: 1 day   Anise Harbin A 10/21/2013, 8:38 AM

## 2013-10-21 NOTE — Discharge Instructions (Signed)
Before Baby Comes Home °Ask any questions about feeding, diapering, and baby care before you leave the hospital. Ask again if you do not understand. Ask when you need to see the doctor again. °There are several things you must have before your baby comes home. °· Infant car seat. °· Crib. °¨ Do not let your baby sleep in a bed with you or anyone else. °¨ If you do not have a bed for your baby, ask the doctor what you can use that will be safe for the baby to sleep in. °Infant feeding supplies: °· 6 to 8 bottles (8 oz. size). °· 6 to 8 nipples. °· Measuring cup. °· Measuring tablespoon. °· Bottle brush. °· Sterilizer (or use any large pan or kettle with a lid). °· Formula that contains iron. °· A way to boil and cool water. °Breastfeeding supplies: °· Breast pump. °· Nipple cream. °Clothing: °· 24 to 36 cloth diapers and waterproof diaper covers or a box of disposable diapers. You may need as many as 10 to 12 diapers per day. °· 3 onesies (other clothing will depend on the time of year and the weather). °· 3 receiving blankets. °· 3 baby pajamas or gowns. °· 3 bibs. °Bath equipment: °· Mild soap. °· Petroleum jelly. No baby oil or powder. °· Soft cloth towel and wash cloth. °· Cotton balls. °· Separate bath basin for baby. Only sponge bathe until umbilical cord and circumcision are healed. °Other supplies: °· Thermometer and bulb syringe (ask the hospital to send them home with you). Ask your doctor about how you should take your baby's temperature. °· One to two pacifiers. °Prepare for an emergency: °· Know how to get to the hospital and know where to admit your baby. °· Put all doctor numbers near your house phone and in your cell phone if you have one. °Prepare your family: °· Talk with siblings about the baby coming home and how they feel about it. °· Decide how you want to handle visitors and other family members. °· Take offers for help with the baby. You will need time to adjust. °Know when to call the doctor.   °GET HELP RIGHT AWAY IF: °· Your baby's temperature is greater than 100.4° F (38° C). °· The softspot on your baby's head starts to bulge. °· Your baby is crying with no tears or has no wet diapers for 6 hours. °· Your baby has rapid breathing. °· Your baby is not as alert. °Document Released: 03/08/2008 Document Revised: 06/18/2011 Document Reviewed: 06/15/2010 °ExitCare® Patient Information ©2015 [employer]. This information is not intended to replace advice given to you by your health care provider. Make sure you discuss any questions you have with your health care provider. ° °

## 2013-10-21 NOTE — Lactation Note (Signed)
This note was copied from the chart of Brandy Beasley. Lactation Consultation Note BF her first child for 7 months but it was 9 yrs. Ago. She doesn't remember having any challenges. This baby is latching on well. Hand expression taught, mom excited to see colostrum. She was wondering if she had anything. Encouraged to elevate breast w/rolled cloth for easier feeding. Good everted nipples. Discussed different feeding positions, importance of good depth for good transfer of milk and to prevent soreness. Mom encouraged to feed baby 8-12 times/24 hours and with feeding cues. Mom reports + breast changes w/pregnancy. Reviewed Baby & Me book's Breastfeeding Basics. Mom encouraged to feed baby w/feeding cuesWH/LC brochure given w/resources, support groups and LC services.Encouraged to call for assistance if needed and to verify proper latch. Patient Name: Brandy Earl GalaSarah Duer Today's Date: 10/21/2013 Reason for consult: Initial assessment   Maternal Data Infant to breast within first hour of birth: Yes Has patient been taught Hand Expression?: Yes Does the patient have breastfeeding experience prior to this delivery?: Yes  Feeding Feeding Type: Breast Fed Length of feed: 15 min (still feeding)  LATCH Score/Interventions Latch: Grasps breast easily, tongue down, lips flanged, rhythmical sucking.  Audible Swallowing: A few with stimulation Intervention(s): Skin to skin;Hand expression;Alternate breast massage  Type of Nipple: Everted at rest and after stimulation  Comfort (Breast/Nipple): Soft / non-tender     Hold (Positioning): Assistance needed to correctly position infant at breast and maintain latch. Intervention(s): Breastfeeding basics reviewed;Support Pillows;Position options;Skin to skin  LATCH Score: 8  Lactation Tools Discussed/Used     Consult Status Consult Status: Follow-up Date: 10/21/13 Follow-up type: In-patient    Sarya Linenberger, Diamond NickelLAURA G 10/21/2013, 2:25 AM

## 2013-10-22 ENCOUNTER — Encounter: Payer: Medicaid Other | Admitting: Obstetrics

## 2013-11-03 ENCOUNTER — Encounter: Payer: Self-pay | Admitting: *Deleted

## 2013-11-03 ENCOUNTER — Ambulatory Visit (INDEPENDENT_AMBULATORY_CARE_PROVIDER_SITE_OTHER): Payer: Medicaid Other | Admitting: Obstetrics

## 2013-11-03 ENCOUNTER — Encounter: Payer: Self-pay | Admitting: Obstetrics

## 2013-11-03 DIAGNOSIS — Z3009 Encounter for other general counseling and advice on contraception: Secondary | ICD-10-CM

## 2013-11-03 NOTE — Progress Notes (Signed)
Subjective:     Brandy SacramentoSarah A Holwerda is a 33 y.o. female who presents for a postpartum visit. She is 2 weeks postpartum following a spontaneous vaginal delivery. I have fully reviewed the prenatal and intrapartum course. The delivery was at 40 gestational weeks. Outcome: spontaneous vaginal delivery. Anesthesia: none. Postpartum course has been normal. Baby's course has been normal. Baby is feeding by breast. Bleeding thin lochia. Bowel function is normal. Bladder function is normal. Patient is not sexually active. Contraception method is abstinence. Postpartum depression screening: negative.  Tobacco, alcohol and substance abuse history reviewed.  Adult immunizations reviewed including TDAP, rubella and varicella.  The following portions of the patient's history were reviewed and updated as appropriate: allergies, current medications, past family history, past medical history, past social history, past surgical history and problem list.  Review of Systems A comprehensive review of systems was negative.   Objective:    BP 115/73  Pulse 43  Temp(Src) 97.6 F (36.4 C)  Ht 5\' 5"  (1.651 m)  Wt 220 lb (99.791 kg)  BMI 36.61 kg/m2  LMP 01/12/2013  Breastfeeding? Yes   PE:  Deferred                                   Assessment:    Postpartum, 2 weeks.  Doing well.  Plan:    1. Contraception: tubal ligation 2. Continue PNV's 3. Follow up in: 4 weeks or as needed.   Healthy lifestyle practices reviewed

## 2013-11-16 ENCOUNTER — Encounter: Payer: Self-pay | Admitting: *Deleted

## 2013-11-16 ENCOUNTER — Telehealth: Payer: Self-pay | Admitting: *Deleted

## 2013-11-16 NOTE — Telephone Encounter (Signed)
Patient called and requested another letter stating she may go back to work full time- because they will not schedule her with restrictions.  Patient states she controls how much she works and she will not be working full time. OK to write the letter per Dr Clearance CootsHarper. Fax [phone]

## 2013-11-25 ENCOUNTER — Ambulatory Visit (INDEPENDENT_AMBULATORY_CARE_PROVIDER_SITE_OTHER): Payer: Medicaid Other | Admitting: Obstetrics & Gynecology

## 2013-11-25 ENCOUNTER — Encounter: Payer: Self-pay | Admitting: *Deleted

## 2013-11-25 VITALS — BP 147/96 | HR 76 | Temp 98.5°F | Wt 208.0 lb

## 2013-11-25 DIAGNOSIS — Z3009 Encounter for other general counseling and advice on contraception: Secondary | ICD-10-CM

## 2013-11-25 NOTE — Patient Instructions (Signed)
Salpingectomy Salpingectomy, also called tubectomy, is the surgical removal of one of the fallopian tubes. The fallopian tubes are tubes that are connected to the uterus. These tubes transport the egg from the ovary to the uterus. A salpingectomy may be done for various reasons, including:   A tubal (ectopic) pregnancy. This is especially true if the tube ruptures.  An infected fallopian tube.  The need to remove the fallopian tube when removing an ovary with a cyst or tumor.  The need to remove the fallopian tube when removing the uterus.  Cancer of the fallopian tube or nearby organs. Removing one fallopian tube does not prevent you from becoming pregnant. It also does not cause problems with your menstrual periods.  LET [facility] CARE PROVIDER KNOW ABOUT:  Any allergies you have.  All medicines you are taking, including vitamins, herbs, eye drops, creams, and over-the-counter medicines.  Previous problems you or members of your family have had with the use of anesthetics.  Any blood disorders you have.  Previous surgeries you have had.  Medical conditions you have. RISKS AND COMPLICATIONS  Generally, this is a safe procedure. However, as with any procedure, complications can occur. Possible complications include:  Injury to surrounding organs.  Bleeding.  Infection.  Problems related to anesthesia. BEFORE THE PROCEDURE  Ask your health care provider about changing or stopping your regular medicines. You may need to stop taking certain medicines, such as aspirin or blood thinners, at least 1 week before the surgery.  Do not eat or drink anything for at least 8 hours before the surgery.  If you smoke, do not smoke for at least 2 weeks before the surgery.  Make plans to have someone drive you home after the procedure or after your hospital stay. Also arrange for someone to help you with activities during recovery. PROCEDURE   You will be given medicine to help you  relax before the procedure (sedative). You will then be given medicine to make you sleep through the procedure (general anesthetic). These medicines will be given through an IV access tube that is put into one of your veins.  Once you are asleep, your lower abdomen will be shaved and cleaned. A thin, flexible tube (catheter) will be placed in your bladder.  The surgeon may use a laparoscopic, robotic, or open technique for this surgery:  In the laparoscopic technique, the surgery is done through two small cuts (incisions) in the abdomen. A thin, lighted tube with a tiny camera on the end (laparoscope) is inserted into one of the incisions. The tools needed for the procedure are put through the other incision.  A robotic technique may be chosen to perform complex surgery in a small space. In the robotic technique, small incisions will be made. A camera and surgical instruments are passed through the incisions. Surgical instruments will be controlled with the help of a robotic arm.  In the open technique, the surgery is done through one large incision in the abdomen.  Using any of these techniques, the surgeon removes the fallopian tube from where it attaches to the uterus. The blood vessels will be clamped and tied.  The surgeon then uses staples or stitches to close the incision or incisions. AFTER THE PROCEDURE   You will be taken to a recovery area where your progress will be monitored for 1-3 hours.  If the laparoscopic technique was used, you may be allowed to go home after several hours. You may have some shoulder pain after  the laparoscopic procedure. This is normal and usually goes away in a day or two.  If the open technique was used, you will be admitted to the hospital for a couple of days.  You will be given pain medicine if needed.  The IV access tube and catheter will be removed before you are discharged. Document Released: 08/12/2008 Document Revised: 01/14/2013 Document  Reviewed: 09/17/2012 [facility] Patient Information 2015 [place], [place]. This information is not intended to replace advice given to you by your health care provider. Make sure you discuss any questions you have with your health care provider. Sterilization Information, Female Female sterilization is a procedure to permanently prevent pregnancy. There are different ways to perform sterilization, but all either block or close the fallopian tubes so that your eggs cannot reach your uterus. If your egg cannot reach your uterus, sperm cannot fertilize the egg, and you cannot get pregnant.  Sterilization is performed by a surgical procedure. Sometimes these procedures are performed in a hospital while a patient is asleep. Sometimes they can be done in a clinic setting with the patient awake. The fallopian tubes can be surgically cut, tied, or sealed through a procedure called tubal ligation. The fallopian tubes can also be closed with clips or rings. Sterilization can also be done by placing a tiny coil into each fallopian tube, which causes scar tissue to grow inside the tube. The scar tissue then blocks the tubes.  Discuss sterilization with your caregiver to answer any concerns you or your partner may have. You may want to ask what type of sterilization your caregiver performs. Some caregivers may not perform all the various options. Sterilization is permanent and should only be done if you are sure you do not want children or do not want any more children. Having a sterilization reversed may not be successful.  STERILIZATION PROCEDURES  Laparoscopic sterilization. This is a surgical method performed at a time other than right after childbirth. Two incisions are made in the lower abdomen. A thin, lighted tube (laparoscope) is inserted into one of the incisions and is used to perform the procedure. The fallopian tubes are closed with a ring or a clip. An instrument that uses heat could be used to seal the tubes  closed (electrocautery).   Mini-laparotomy. This is a surgical method done 1 or 2 days after giving birth. Typically, a small incision is made just below the belly button (umbilicus) and the fallopian tubes are exposed. The tubes can then be sealed, tied, or cut.   Hysteroscopic sterilization. This is performed at a time other than right after childbirth. A tiny, spring-like coil is inserted through the cervix and uterus and placed into the fallopian tubes. The coil causes scaring and blocks the tubes. Other forms of contraception should be used for 3 months after the procedure to allow the scar tissue to form completely. Additionally, it is required hysterosalpingography be done 3 months later to ensure that the procedure was successful. Hysterosalpingography is a procedure that uses X-rays to look at your uterus and fallopian tubes after a material to make them show up better has been inserted. IS STERILIZATION SAFE? Sterilization is considered safe with very rare complications. Risks depend on the type of procedure you have. As with any surgical procedure, there are risks. Some risks of sterilization by any means include:   Bleeding.  Infection.  Reaction to anesthesia medicine.  Injury to surrounding organs. Risks specific to having hysteroscopic coils placed include:  The coils may not  be placed correctly the first time.   The coils may move out of place.   The tubes may not get completely blocked after 3 months.   Injury to surrounding organs when placing the coil.  HOW EFFECTIVE IS FEMALE STERILIZATION? Sterilization is nearly 100% effective, but it can fail. Depending on the type of sterilization, the rate of failure can be as high as 3%. After hysteroscopic sterilization with placement of fallopian tube coils, you will need back-up birth control for 3 months after the procedure. Sterilization is effective for a lifetime.  BENEFITS OF STERILIZATION  It does not affect your  hormones, and therefore will not affect your menstrual periods, sexual desire, or performance.   It is effective for a lifetime.   It is safe.   You do not need to worry about getting pregnant. Keep in mind that if you had the hysteroscopic placement procedure, you must wait 3 months after the procedure (or until your caregiver confirms) before pregnancy is not considered possible.   There are no side effects unlike other types of birth control (contraception).  DRAWBACKS OF STERILIZATION  You must be sure you do not want children or any more children. The procedure is permanent.   It does not provide protection against sexually transmitted infections (STIs).   The tubes can grow back together. If this happens, there is a risk of pregnancy. There is also an increased risk (50%) of pregnancy being an ectopic pregnancy. This is a pregnancy that happens outside of the uterus. Document Released: 09/12/2007 Document Revised: 03/31/2013 Document Reviewed: 07/12/2011 [facility] Patient Information 2015 [place], [place]. This information is not intended to replace advice given to you by your health care provider. Make sure you discuss any questions you have with your health care provider.

## 2013-11-27 ENCOUNTER — Encounter: Payer: Self-pay | Admitting: Obstetrics & Gynecology

## 2013-11-27 NOTE — Progress Notes (Signed)
Patient ID: Brandy Beasley, female   DOB: [birth year], 33 y.o.   MRN: [id number]  Chief Complaint  Patient presents with  . Advice Only    tubal    HPI Brandy Beasley is a 33 y.o. female.  She presents for a pre op visit--she requests a sterilization HPI  Past Medical History  Diagnosis Date  . Medical history non-contributory     Past Surgical History  Procedure Laterality Date  . Cholecystectomy      Family History  Problem Relation Age of Onset  . Hypertension Other   . Diabetes Other   . Cancer Other   . Hypertension Mother   . HIV Father   . Diabetes Maternal Grandmother   . Hypertension Maternal Grandmother   . Cancer Maternal Grandfather     Social History History  Substance Use Topics  . Smoking status: Current Some Day Smoker -- 0.25 packs/day    Types: Cigarettes  . Smokeless tobacco: Never Used     Comment: states she is cutting back  . Alcohol Use: No    No Known Allergies  Current Outpatient Prescriptions  Medication Sig Dispense Refill  . Prenatal Vit-Fe Fumarate-FA (PRENATAL MULTIVITAMIN) TABS tablet Take 1 tablet by mouth daily at 12 noon.      Marland Kitchen. ibuprofen (ADVIL,MOTRIN) 600 MG tablet Take 1 tablet (600 mg total) by mouth every 6 (six) hours as needed for moderate pain.  30 tablet  5  . oxyCODONE-acetaminophen (PERCOCET/ROXICET) 5-325 MG per tablet Take 1-2 tablets by mouth every 4 (four) hours as needed for severe pain (moderate - severe pain).  40 tablet  0   No current facility-administered medications for this visit.    Review of Systems Review of Systems Constitutional: negative for fatigue and weight loss Respiratory: negative for cough and wheezing Cardiovascular: negative for chest pain, fatigue and palpitations Gastrointestinal: negative for abdominal pain and change in bowel habits Genitourinary:negative for abnormal vaginal discharge Integument/breast: negative for nipple discharge Musculoskeletal:negative for  myalgias Neurological: negative for gait problems and tremors Behavioral/Psych: negative for abusive relationship, depression Endocrine: negative for temperature intolerance     Blood pressure 147/96, pulse 76, temperature 98.5 F (36.9 C), weight 94.348 kg (208 lb), currently breastfeeding.  Physical Exam Physical Exam General:   alert  Skin:   no rash or abnormalities  Lungs:   clear to auscultation bilaterally  Heart:   regular rate and rhythm, S1, S2 normal, no murmur, click, rub or gallop  Breasts:   normal without suspicious masses, skin or nipple changes or axillary nodes  Abdomen:  normal findings: no organomegaly, soft, non-tender and no hernia  Pelvis:  External genitalia: normal general appearance Urinary system: urethral meatus normal and bladder without fullness, nontender Vaginal: normal without tenderness, induration or masses Cervix: normal appearance Adnexa: normal bimanual exam Uterus: anteverted and non-tender, normal size      Data Reviewed None  Assessment    Requests a sterilization procedure     Plan    Opportunistic bilateral salpingectomies Follow up as needed.         JACKSON-MOORE,Errik Mitchelle A 11/27/2013, 1:58 PM

## 2013-12-01 ENCOUNTER — Other Ambulatory Visit: Payer: Self-pay | Admitting: *Deleted

## 2013-12-09 ENCOUNTER — Encounter (HOSPITAL_COMMUNITY): Payer: Self-pay | Admitting: *Deleted

## 2013-12-16 ENCOUNTER — Encounter (HOSPITAL_COMMUNITY): Payer: Self-pay | Admitting: Pharmacist

## 2013-12-24 NOTE — H&P (Signed)
  Chief Complaint: 33 y.o.  who presents for a sterilization procedure  Details of Present Illness: See above.  LMP 12/04/2013  Past Medical History  Diagnosis Date  . Medical history non-contributory    History   Social History  . Marital Status: Single    Spouse Name: N/A    Number of Children: N/A  . Years of Education: N/A   Occupational History  . Not on file.   Social History Main Topics  . Smoking status: Current Some Day Smoker -- 0.25 packs/day    Types: Cigarettes  . Smokeless tobacco: Never Used     Comment: states she is cutting back  . Alcohol Use: No  . Drug Use: No     Comment: occasional last use 10-22-13  . Sexual Activity: Not Currently   Other Topics Concern  . Not on file   Social History Narrative  . No narrative on file   Family History  Problem Relation Age of Onset  . Hypertension Other   . Diabetes Other   . Cancer Other   . Hypertension Mother   . HIV Father   . Diabetes Maternal Grandmother   . Hypertension Maternal Grandmother   . Cancer Maternal Grandfather     Pertinent items are noted in HPI.  Pre-Op Diagnosis: desires sterilization   Planned Procedure: Procedure(s): OPPORTUNISTIC LAPAROSCOPIC BILATERAL SALPINGECTOMY  I have reviewed the patient's history and have completed the physical exam and Brandy Beasley is acceptable for surgery.  [name], MD 12/24/2013 10:53 AM

## 2013-12-25 ENCOUNTER — Ambulatory Visit (HOSPITAL_COMMUNITY): Payer: Medicaid Other | Admitting: Anesthesiology

## 2013-12-25 ENCOUNTER — Encounter (HOSPITAL_COMMUNITY): Payer: Self-pay | Admitting: Anesthesiology

## 2013-12-25 ENCOUNTER — Encounter (HOSPITAL_COMMUNITY): Payer: Medicaid Other | Admitting: Anesthesiology

## 2013-12-25 ENCOUNTER — Encounter (HOSPITAL_COMMUNITY)
Admission: RE | Disposition: A | Discharge status: Home / Self Care | Payer: Self-pay | Source: Ambulatory Visit | Attending: Obstetrics & Gynecology

## 2013-12-25 ENCOUNTER — Ambulatory Visit (HOSPITAL_COMMUNITY)
Admission: RE | Admit: 2013-12-25 | Discharge: 2013-12-25 | Disposition: A | Discharge status: Home / Self Care | Payer: Medicaid Other | Source: Ambulatory Visit | Attending: Obstetrics & Gynecology | Admitting: Obstetrics & Gynecology

## 2013-12-25 DIAGNOSIS — E669 Obesity, unspecified: Secondary | ICD-10-CM | POA: Insufficient documentation

## 2013-12-25 DIAGNOSIS — Z6835 Body mass index (BMI) 35.0-35.9, adult: Secondary | ICD-10-CM | POA: Diagnosis not present

## 2013-12-25 DIAGNOSIS — Z302 Encounter for sterilization: Secondary | ICD-10-CM

## 2013-12-25 DIAGNOSIS — Z641 Problems related to multiparity: Secondary | ICD-10-CM | POA: Insufficient documentation

## 2013-12-25 DIAGNOSIS — K219 Gastro-esophageal reflux disease without esophagitis: Secondary | ICD-10-CM | POA: Insufficient documentation

## 2013-12-25 DIAGNOSIS — F17200 Nicotine dependence, unspecified, uncomplicated: Secondary | ICD-10-CM | POA: Diagnosis not present

## 2013-12-25 DIAGNOSIS — Z9079 Acquired absence of other genital organ(s): Secondary | ICD-10-CM

## 2013-12-25 HISTORY — PX: LAPAROSCOPIC BILATERAL SALPINGECTOMY: SHX5889

## 2013-12-25 LAB — CBC
HEMATOCRIT: 38.6 % (ref 36.0–46.0)
Hemoglobin: 12.4 g/dL (ref 12.0–15.0)
MCH: 27.1 pg (ref 26.0–34.0)
MCHC: 32.1 g/dL (ref 30.0–36.0)
MCV: 84.3 fL (ref 78.0–100.0)
Platelets: 173 10*3/uL (ref 150–400)
RBC: 4.58 MIL/uL (ref 3.87–5.11)
RDW: 13.9 % (ref 11.5–15.5)
WBC: 8.4 10*3/uL (ref 4.0–10.5)

## 2013-12-25 LAB — PREGNANCY, URINE: Preg Test, Ur: NEGATIVE

## 2013-12-25 SURGERY — SALPINGECTOMY, BILATERAL, LAPAROSCOPIC
Anesthesia: General | Laterality: Bilateral

## 2013-12-25 MED ORDER — KETOROLAC TROMETHAMINE 30 MG/ML IJ SOLN
INTRAMUSCULAR | Status: DC | PRN
  Administered 2013-12-25: 30 mg via INTRAVENOUS

## 2013-12-25 MED ORDER — OXYCODONE-ACETAMINOPHEN 5-325 MG PO TABS: 1.0000 | ORAL_TABLET | ORAL | Status: DC | PRN

## 2013-12-25 MED ORDER — GLYCOPYRROLATE 0.2 MG/ML IJ SOLN
INTRAMUSCULAR | Status: DC | PRN
  Administered 2013-12-25: .4 mg via INTRAVENOUS
  Administered 2013-12-25: .2 mg via INTRAVENOUS

## 2013-12-25 MED ORDER — FENTANYL CITRATE 0.05 MG/ML IJ SOLN
INTRAMUSCULAR | Status: DC | PRN
  Administered 2013-12-25: 50 ug via INTRAVENOUS
  Administered 2013-12-25: 100 ug via INTRAVENOUS
  Administered 2013-12-25 (×2): 50 ug via INTRAVENOUS

## 2013-12-25 MED ORDER — GLYCOPYRROLATE 0.2 MG/ML IJ SOLN
INTRAMUSCULAR | Status: AC
Start: 2013-12-25 — End: 2013-12-25
  Filled 2013-12-25: qty 1

## 2013-12-25 MED ORDER — MIDAZOLAM HCL 2 MG/2ML IJ SOLN
INTRAMUSCULAR | Status: DC | PRN
  Administered 2013-12-25: 2 mg via INTRAVENOUS

## 2013-12-25 MED ORDER — KETOROLAC TROMETHAMINE 30 MG/ML IJ SOLN
INTRAMUSCULAR | Status: AC
  Filled 2013-12-25: qty 1

## 2013-12-25 MED ORDER — PROPOFOL 10 MG/ML IV EMUL
INTRAVENOUS | Status: AC
  Filled 2013-12-25: qty 20

## 2013-12-25 MED ORDER — METOCLOPRAMIDE HCL 5 MG/ML IJ SOLN: 10.0000 mg | Freq: Once | INTRAMUSCULAR | Status: DC | PRN

## 2013-12-25 MED ORDER — ATROPINE SULFATE 0.4 MG/ML IJ SOLN
INTRAMUSCULAR | Status: AC
  Filled 2013-12-25: qty 1

## 2013-12-25 MED ORDER — OXYCODONE HCL 5 MG PO TABS: 5.0000 mg | ORAL_TABLET | ORAL | Status: DC | PRN

## 2013-12-25 MED ORDER — DEXAMETHASONE SODIUM PHOSPHATE 10 MG/ML IJ SOLN
INTRAMUSCULAR | Status: DC | PRN
  Administered 2013-12-25: 4 mg via INTRAVENOUS

## 2013-12-25 MED ORDER — ACETAMINOPHEN 325 MG PO TABS: 650.0000 mg | ORAL_TABLET | ORAL | Status: DC | PRN

## 2013-12-25 MED ORDER — SODIUM CHLORIDE 0.9 % IJ SOLN: 3.0000 mL | Freq: Two times a day (BID) | INTRAMUSCULAR | Status: DC

## 2013-12-25 MED ORDER — PROPOFOL 10 MG/ML IV EMUL
INTRAVENOUS | Status: AC
Start: 2013-12-25 — End: 2013-12-25
  Filled 2013-12-25: qty 20

## 2013-12-25 MED ORDER — SODIUM CHLORIDE 0.9 % IV SOLN: 250.0000 mL | INTRAVENOUS | Status: DC | PRN

## 2013-12-25 MED ORDER — OXYCODONE-ACETAMINOPHEN 5-325 MG PO TABS
ORAL_TABLET | ORAL | Status: AC
  Filled 2013-12-25: qty 1

## 2013-12-25 MED ORDER — FENTANYL CITRATE 0.05 MG/ML IJ SOLN: 25.0000 ug | INTRAMUSCULAR | Status: DC | PRN

## 2013-12-25 MED ORDER — BUPIVACAINE HCL (PF) 0.25 % IJ SOLN
INTRAMUSCULAR | Status: DC | PRN
  Administered 2013-12-25: 11 mL

## 2013-12-25 MED ORDER — 0.9 % SODIUM CHLORIDE (POUR BTL) OPTIME
TOPICAL | Status: DC | PRN
  Administered 2013-12-25: 1000 mL

## 2013-12-25 MED ORDER — GLYCOPYRROLATE 0.2 MG/ML IJ SOLN
INTRAMUSCULAR | Status: AC
  Filled 2013-12-25: qty 2

## 2013-12-25 MED ORDER — BUPIVACAINE HCL (PF) 0.25 % IJ SOLN
INTRAMUSCULAR | Status: AC
  Filled 2013-12-25: qty 30

## 2013-12-25 MED ORDER — SCOPOLAMINE 1 MG/3DAYS TD PT72
MEDICATED_PATCH | TRANSDERMAL | Status: AC
  Filled 2013-12-25: qty 1

## 2013-12-25 MED ORDER — LACTATED RINGERS IV SOLN
INTRAVENOUS | Status: DC
  Administered 2013-12-25 (×2): via INTRAVENOUS

## 2013-12-25 MED ORDER — SODIUM CHLORIDE 0.9 % IJ SOLN: 3.0000 mL | INTRAMUSCULAR | Status: DC | PRN

## 2013-12-25 MED ORDER — ROCURONIUM BROMIDE 100 MG/10ML IV SOLN
INTRAVENOUS | Status: AC
  Filled 2013-12-25: qty 1

## 2013-12-25 MED ORDER — SCOPOLAMINE 1 MG/3DAYS TD PT72
1.0000 | MEDICATED_PATCH | Freq: Once | TRANSDERMAL | Status: DC
  Administered 2013-12-25: 1.5 mg via TRANSDERMAL

## 2013-12-25 MED ORDER — ATROPINE SULFATE 0.4 MG/ML IJ SOLN
INTRAMUSCULAR | Status: DC | PRN
  Administered 2013-12-25: .4 mg via INTRAVENOUS

## 2013-12-25 MED ORDER — ONDANSETRON HCL 4 MG/2ML IJ SOLN
INTRAMUSCULAR | Status: AC
Start: 2013-12-25 — End: 2013-12-25
  Filled 2013-12-25: qty 2

## 2013-12-25 MED ORDER — LIDOCAINE HCL (CARDIAC) 20 MG/ML IV SOLN
INTRAVENOUS | Status: AC
  Filled 2013-12-25: qty 5

## 2013-12-25 MED ORDER — MORPHINE SULFATE 4 MG/ML IJ SOLN: 4.0000 mg | INTRAMUSCULAR | Status: DC | PRN

## 2013-12-25 MED ORDER — OXYCODONE-ACETAMINOPHEN 5-325 MG PO TABS
1.0000 | ORAL_TABLET | ORAL | Status: DC | PRN
  Administered 2013-12-25: 1 via ORAL

## 2013-12-25 MED ORDER — ROCURONIUM BROMIDE 100 MG/10ML IV SOLN
INTRAVENOUS | Status: DC | PRN
  Administered 2013-12-25: 10 mg via INTRAVENOUS
  Administered 2013-12-25: 30 mg via INTRAVENOUS

## 2013-12-25 MED ORDER — MEPERIDINE HCL 25 MG/ML IJ SOLN: 6.2500 mg | INTRAMUSCULAR | Status: DC | PRN

## 2013-12-25 MED ORDER — IBUPROFEN 800 MG PO TABS: 800.0000 mg | ORAL_TABLET | Freq: Three times a day (TID) | ORAL | Status: DC | PRN

## 2013-12-25 MED ORDER — NEOSTIGMINE METHYLSULFATE 10 MG/10ML IV SOLN
INTRAVENOUS | Status: DC | PRN
  Administered 2013-12-25: 4 mg via INTRAVENOUS

## 2013-12-25 MED ORDER — ACETAMINOPHEN 650 MG RE SUPP
650.0000 mg | RECTAL | Status: DC | PRN
  Filled 2013-12-25: qty 1

## 2013-12-25 MED ORDER — MIDAZOLAM HCL 2 MG/2ML IJ SOLN
INTRAMUSCULAR | Status: AC
  Filled 2013-12-25: qty 2

## 2013-12-25 MED ORDER — ONDANSETRON HCL 4 MG/2ML IJ SOLN
INTRAMUSCULAR | Status: DC | PRN
  Administered 2013-12-25: 4 mg via INTRAVENOUS

## 2013-12-25 MED ORDER — FENTANYL CITRATE 0.05 MG/ML IJ SOLN
INTRAMUSCULAR | Status: AC
  Filled 2013-12-25: qty 5

## 2013-12-25 MED ORDER — KETOROLAC TROMETHAMINE 30 MG/ML IJ SOLN: 30.0000 mg | Freq: Four times a day (QID) | INTRAMUSCULAR | Status: DC

## 2013-12-25 MED ORDER — LIDOCAINE HCL (CARDIAC) 20 MG/ML IV SOLN
INTRAVENOUS | Status: DC | PRN
  Administered 2013-12-25: 80 mg via INTRAVENOUS

## 2013-12-25 MED ORDER — NEOSTIGMINE METHYLSULFATE 10 MG/10ML IV SOLN
INTRAVENOUS | Status: AC
  Filled 2013-12-25: qty 1

## 2013-12-25 MED ORDER — DEXAMETHASONE SODIUM PHOSPHATE 4 MG/ML IJ SOLN
INTRAMUSCULAR | Status: AC
  Filled 2013-12-25: qty 1

## 2013-12-25 MED ORDER — PROPOFOL 10 MG/ML IV BOLUS
INTRAVENOUS | Status: DC | PRN
  Administered 2013-12-25: 200 mg via INTRAVENOUS

## 2013-12-25 SURGICAL SUPPLY — 30 items
BAG SPEC RTRVL LRG 6X4 10 (ENDOMECHANICALS)
BLADE SURG 11 STRL SS (BLADE) ×3 IMPLANT
CABLE HIGH FREQUENCY MONO STRZ (ELECTRODE) IMPLANT
CATH ROBINSON RED A/P 16FR (CATHETERS) ×3 IMPLANT
CHLORAPREP W/TINT 26ML (MISCELLANEOUS) ×3 IMPLANT
CLOTH BEACON ORANGE TIMEOUT ST (SAFETY) ×3 IMPLANT
CONT SPECI 4OZ STER CLIK (MISCELLANEOUS) IMPLANT
DRSG COVADERM PLUS 2X2 (GAUZE/BANDAGES/DRESSINGS) ×6 IMPLANT
DRSG OPSITE POSTOP 3X4 (GAUZE/BANDAGES/DRESSINGS) IMPLANT
EVACUATOR SMOKE 8.L (FILTER) ×3 IMPLANT
GLOVE BIO SURGEON STRL SZ 6.5 (GLOVE) ×4 IMPLANT
GLOVE BIO SURGEONS STRL SZ 6.5 (GLOVE) ×2
GOWN STRL REUS W/TWL LRG LVL3 (GOWN DISPOSABLE) ×6 IMPLANT
NS IRRIG 1000ML POUR BTL (IV SOLUTION) ×3 IMPLANT
PACK LAPAROSCOPY BASIN (CUSTOM PROCEDURE TRAY) ×3 IMPLANT
POUCH SPECIMEN RETRIEVAL 10MM (ENDOMECHANICALS) IMPLANT
PROTECTOR NERVE ULNAR (MISCELLANEOUS) ×3 IMPLANT
SCRUB PCMX 4 OZ (MISCELLANEOUS) ×3 IMPLANT
SEALER TISSUE G2 CVD JAW 35 (ENDOMECHANICALS) IMPLANT
SEALER TISSUE G2 CVD JAW 45CM (ENDOMECHANICALS) ×2
SET IRRIG TUBING LAPAROSCOPIC (IRRIGATION / IRRIGATOR) IMPLANT
SUT MNCRL AB 4-0 PS2 18 (SUTURE) IMPLANT
SUT VICRYL 0 UR6 27IN ABS (SUTURE) ×3 IMPLANT
SUT VICRYL 4-0 PS2 18IN ABS (SUTURE) IMPLANT
TOWEL OR 17X24 6PK STRL BLUE (TOWEL DISPOSABLE) ×6 IMPLANT
TRAY FOLEY CATH 14FR (SET/KITS/TRAYS/PACK) ×2 IMPLANT
TROCAR XCEL NON-BLD 11X100MML (ENDOMECHANICALS) ×3 IMPLANT
TROCAR XCEL NON-BLD 5MMX100MML (ENDOMECHANICALS) ×8 IMPLANT
WARMER LAPAROSCOPE (MISCELLANEOUS) ×3 IMPLANT
WATER STERILE IRR 1000ML POUR (IV SOLUTION) ×3 IMPLANT

## 2013-12-25 NOTE — Interval H&P Note (Signed)
History and Physical Interval Note:  12/25/2013 11:49 AM  Brandy Beasley  has presented today for surgery, with the diagnosis of desires sterilization  The various methods of treatment have been discussed with the patient and family. After consideration of risks, benefits and other options for treatment, the patient has consented to  Procedure(s): LAPAROSCOPIC BILATERAL SALPINGECTOMY (Bilateral) as a surgical intervention .  The patient's history has been reviewed, patient examined, no change in status, stable for surgery.  I have reviewed the patient's chart and labs.  Questions were answered to the patient's satisfaction.     JACKSON-MOORE,Dannya Pitkin A

## 2013-12-25 NOTE — Anesthesia Preprocedure Evaluation (Addendum)
Anesthesia Evaluation  Patient identified by MRN, date of birth, ID band Patient awake    Reviewed: Allergy & Precautions, H&P , NPO status , Patient's Chart, lab work & pertinent test results  Airway Mallampati: III TM Distance: >3 FB Neck ROM: Full    Dental no notable dental hx. (+) Teeth Intact,    Pulmonary Current Smoker,  breath sounds clear to auscultation  Pulmonary exam normal       Cardiovascular negative cardio ROS  Rhythm:Regular Rate:Normal     Neuro/Psych  Headaches, negative psych ROS   GI/Hepatic Neg liver ROS, GERD-  Medicated and Controlled,  Endo/Other  Obesity  Renal/GU negative Renal ROS  negative genitourinary   Musculoskeletal negative musculoskeletal ROS (+)   Abdominal (+) + obese,   Peds  Hematology negative hematology ROS (+)   Anesthesia Other Findings   Reproductive/Obstetrics Undesired fertility                          Anesthesia Physical Anesthesia Plan  ASA: II  Anesthesia Plan: General   Post-op Pain Management:    Induction: Intravenous  Airway Management Planned: Oral ETT  Additional Equipment:   Intra-op Plan:   Post-operative Plan: Extubation in OR  Informed Consent: I have reviewed the patients History and Physical, chart, labs and discussed the procedure including the risks, benefits and alternatives for the proposed anesthesia with the patient or authorized representative who has indicated his/her understanding and acceptance.   Dental advisory given  Plan Discussed with: CRNA, Anesthesiologist and Surgeon  Anesthesia Plan Comments:         Anesthesia Quick Evaluation

## 2013-12-25 NOTE — Discharge Instructions (Addendum)

## 2013-12-25 NOTE — Op Note (Signed)
Procedure Note  BEVAN VU 33 y.o. 12/25/2013  Preoperative Diagnosis:  Multiparity, desires a sterilization procedure  Postoperative Diagnosis: Same  Procedure: Laparoscopic bilateral salpingectomies  Surgeon: Antionette Char A  Indications:  The patient now presents for a sterilization procedure after discussing therapeutic alternatives.        Procedure Detail:  The patient was taken to the operating room and was placed on the operating table in the dorsal supine position.  After his satisfactory general anesthesia was achieved, the patient was placed in the semi-lithotomy position using Allen stirrups. The patient was prepped and draped in the usual sterile manner for vaginal laparoscopic procedure. A speculum was placed in the vagina. The anterior lip of the cervix was grasped with a single-tooth tenaculum. A Hulka manipulator was then advanced into the uterus and secured  to the anterior lip of the cervix as a means to manipulate the uterus. The single-tooth tenaculum and Hulka manipulator were then removed. The infraumbilical region was then anesthetized with local anesthesia, 0.25%  Marcaine. A small incision was made to the skin and subcutaneous tissue. A 5 mm Optiview trocar was placed through the incision into the abdominal cavity diagnostic laparoscope with video camera attached was placed through the trocar sleeve and carbon dioxide was used to insufflate the abdominal and pelvic cavity. The pelvic contents were examined and the findings were described below. Bilateral 5 mm lower quadrant and an 11 mm suprapubic port were all placed under direct vision.   The left fallopian tube was identified and traced out to its fimbriated end. Then starting at the distal fimbriated end,the mesosalpinx was progressively sealed and transected with the Enseal device.  The proximal tube was then sealed and transected.  Adequate hemostasis was noted. The proximal right fallopian was sealed and  transected with the Enseal device.  The mesosalpinx was then progressively sealed and transected. The lower abdominal ports were removed.   The scope was removed and the excess carbon dioxide was remove through the port, before it was removed.  The subcutaneous layer of the suprapubic incision was reapproximated with an interrupted figure-of eight 0-Vicryl suture on a UR 6 needle. The skin was reapproximated with running subcuticluar stitches of 4-0 Vicryl suture. A skin adhesive was applied.  The instruments were removed from the vagina and there was minimal bleeding from the cervix. Final sponge, instrument and needle counts were correct. The patient was awakened on the operating table and taken to the PACU in satisfactory condition.    Findings: Normal uterus, tubes and ovaries  Estimated Blood Loss:  Minimal              Total IV Fluids: per Anesthesiology        Condition: stable

## 2013-12-25 NOTE — Transfer of Care (Signed)
Immediate Anesthesia Transfer of Care Note  Patient: Brandy Beasley  Procedure(s) Performed: Procedure(s): LAPAROSCOPIC BILATERAL SALPINGECTOMY (Bilateral)  Patient Location: PACU  Anesthesia Type:General  Level of Consciousness: awake, alert  and oriented  Airway & Oxygen Therapy: Patient Spontanous Breathing and Patient connected to nasal cannula oxygen  Post-op Assessment: Report given to PACU RN and Post -op Vital signs reviewed and stable  Post vital signs: Reviewed and stable  Complications: No apparent anesthesia complications

## 2013-12-25 NOTE — Anesthesia Postprocedure Evaluation (Signed)
  Anesthesia Post-op Note  Patient: Brandy Beasley  Procedure(s) Performed: Procedure(s): LAPAROSCOPIC BILATERAL SALPINGECTOMY (Bilateral)  Patient Location: PACU  Anesthesia Type:General  Level of Consciousness: awake, alert  and oriented  Airway and Oxygen Therapy: Patient Spontanous Breathing  Post-op Pain: mild  Post-op Assessment: Post-op Vital signs reviewed, Patient's Cardiovascular Status Stable, Respiratory Function Stable, Patent Airway, No signs of Nausea or vomiting and Pain level controlled  Post-op Vital Signs: Reviewed and stable  Last Vitals:  Filed Vitals:   12/25/13 1345  BP: 153/85  Pulse: 56  Temp: 36.9 C  Resp: 15    Complications: No apparent anesthesia complications

## 2013-12-28 ENCOUNTER — Encounter (HOSPITAL_COMMUNITY): Payer: Self-pay | Admitting: Obstetrics & Gynecology

## 2014-02-08 ENCOUNTER — Encounter (HOSPITAL_COMMUNITY): Payer: Self-pay | Admitting: Obstetrics & Gynecology

## 2014-04-05 ENCOUNTER — Encounter: Payer: Self-pay | Admitting: *Deleted

## 2014-04-06 ENCOUNTER — Encounter: Payer: Self-pay | Admitting: Obstetrics & Gynecology

## 2017-01-05 DIAGNOSIS — L03116 Cellulitis of left lower limb: Secondary | ICD-10-CM | POA: Diagnosis not present

## 2017-01-05 DIAGNOSIS — W57XXXA Bitten or stung by nonvenomous insect and other nonvenomous arthropods, initial encounter: Secondary | ICD-10-CM | POA: Insufficient documentation

## 2017-01-05 DIAGNOSIS — F17210 Nicotine dependence, cigarettes, uncomplicated: Secondary | ICD-10-CM | POA: Insufficient documentation

## 2017-01-05 DIAGNOSIS — M25562 Pain in left knee: Secondary | ICD-10-CM | POA: Diagnosis present

## 2017-01-05 NOTE — ED Triage Notes (Signed)
Pt c/o of what she thinks is an insect bite. Reddened, edematous area noted to medial knee and moving up thigh. Area is hot to touch. A&Ox4. Ambulatory.

## 2017-01-06 ENCOUNTER — Emergency Department (HOSPITAL_COMMUNITY)
Admission: EM | Admit: 2017-01-06 | Discharge: 2017-01-06 | Disposition: A | Discharge status: Home / Self Care | Payer: Medicaid Other | Attending: Emergency Medicine | Admitting: Emergency Medicine

## 2017-01-06 ENCOUNTER — Ambulatory Visit (HOSPITAL_COMMUNITY)
Admission: RE | Admit: 2017-01-06 | Discharge: 2017-01-06 | Disposition: A | Discharge status: Home / Self Care | Payer: Medicaid Other | Source: Ambulatory Visit | Attending: Emergency Medicine | Admitting: Emergency Medicine

## 2017-01-06 ENCOUNTER — Encounter (HOSPITAL_COMMUNITY): Payer: Self-pay

## 2017-01-06 ENCOUNTER — Emergency Department (HOSPITAL_COMMUNITY): Payer: Medicaid Other

## 2017-01-06 DIAGNOSIS — M7989 Other specified soft tissue disorders: Secondary | ICD-10-CM | POA: Diagnosis not present

## 2017-01-06 DIAGNOSIS — M79609 Pain in unspecified limb: Secondary | ICD-10-CM | POA: Diagnosis not present

## 2017-01-06 DIAGNOSIS — L03116 Cellulitis of left lower limb: Secondary | ICD-10-CM

## 2017-01-06 MED ORDER — DOXYCYCLINE HYCLATE 100 MG PO TABS
100.0000 mg | ORAL_TABLET | Freq: Once | ORAL | Status: AC
  Administered 2017-01-06: 100 mg via ORAL
  Filled 2017-01-06: qty 1

## 2017-01-06 MED ORDER — DOXYCYCLINE HYCLATE 100 MG PO CAPS: 100.0000 mg | ORAL_CAPSULE | Freq: Two times a day (BID) | ORAL | 0 refills | Status: DC

## 2017-01-06 NOTE — Progress Notes (Signed)
VASCULAR LAB PRELIMINARY  PRELIMINARY  PRELIMINARY  PRELIMINARY  Left lower extremity venous duplex completed.    Preliminary report:  There is no DVT or SVT noted in the left lower extremity.  Arnika Larzelere, RVT 01/06/2017, 1:07 PM

## 2017-01-06 NOTE — Discharge Instructions (Signed)
It was my pleasure taking care of you today!   Please take all of your antibiotics until finished!   Please present to [facility] tomorrow morning at 8am for lower extremity duplex or call [phone] first thing in the morning to set up an appointment later in the day.   Return to ER for new or worsening symptoms, any additional concerns.

## 2017-01-06 NOTE — ED Provider Notes (Signed)
[facility]-EMERGENCY DEPT Provider Note   CSN: [id number] Arrival date & time: 01/05/17  2202     History   Chief Complaint Chief Complaint  Patient presents with  . Cellulitis    HPI Brandy Beasley is a 36 y.o. female.  The history is provided by the patient and medical records. No language interpreter was used.   Brandy Beasley is a 36 y.o. female with no pertinent PMH who presents to the Emergency Department complaining of gradually worsening pain and redness to the side and back of the left knee. Patient states that symptoms began 3 days ago. She believes that she was bitten by an insect at that time, however did not see anything specifically bite her. She noticed redness at the site which has been progressively worsening. Patient states that the pain is so bad now, but she is ambulating with a limp. No open wounds Pain is worse with movement of the knee, better with rest. Denies any known tick bites or camping. Not on OCP's. No hx of DVT/PE. No clotting disorders. No recent travel. No cough, chest pain or shortness of breath.   Past Medical History:  Diagnosis Date  . Medical history non-contributory   . Vaginal delivery 2002, 2006, 2015    Patient Active Problem List   Diagnosis Date Noted  . Indication for care in labor or delivery 10/20/2013  . Vaginal delivery 10/20/2013  . Vomiting and diarrhea 08/27/2013  . Headache 08/27/2013  . Supervision of other normal pregnancy 07/05/2013  . Tobacco use complicating pregnancy 07/05/2013  . Candidiasis of vulva and vagina 05/07/2013  . BV (bacterial vaginosis) 02/12/2013  . GERD without esophagitis 02/12/2013    Past Surgical History:  Procedure Laterality Date  . CHOLECYSTECTOMY    . LAPAROSCOPIC BILATERAL SALPINGECTOMY Bilateral 12/25/2013   Procedure: LAPAROSCOPIC BILATERAL SALPINGECTOMY;  Surgeon: Antionette Char, MD;  Location: [facility];  Service: Gynecology;  Laterality: Bilateral;    OB History    Gravida Para Term  Preterm AB Living   SAB TAB Ectopic Multiple Live Births   Home Medications    Prior to Admission medications   Medication Sig Start Date End Date Taking? Authorizing Provider  doxycycline (VIBRAMYCIN) 100 MG capsule Take 1 capsule (100 mg total) by mouth 2 (two) times daily. 01/06/17   Mada Sadik, Chase Picket, PA-C  ibuprofen (ADVIL,MOTRIN) 800 MG tablet Take 1 tablet (800 mg total) by mouth every 8 (eight) hours as needed. 12/25/13   Brock Bad, MD  oxyCODONE-acetaminophen (ROXICET) 5-325 MG per tablet Take 1-2 tablets by mouth every 4 (four) hours as needed for severe pain. 12/25/13   Brock Bad, MD    Family History Family History  Problem Relation Age of Onset  . Hypertension Other   . Diabetes Other   . Cancer Other   . Hypertension Mother   . HIV Father   . Diabetes Maternal Grandmother   . Hypertension Maternal Grandmother   . Cancer Maternal Grandfather     Social History Social History  Substance Use Topics  . Smoking status: Current Some Day Smoker    Packs/day: 0.25    Types: Cigarettes  . Smokeless tobacco: Never Used     Comment: states she is cutting back  . Alcohol use No     Allergies   Patient has no known allergies.   Review of Systems  Review of Systems  Musculoskeletal: Positive for myalgias.  Skin: Positive for color change.  Neurological: Negative for weakness and numbness.  All other systems reviewed and are negative.    Physical Exam Updated Vital Signs BP 120/76 (BP Location: Right Arm)   Pulse 68   Temp 98.3 F (36.8 C) (Oral)   Resp 18   Ht  (1.651 m)   Wt 103.9 kg (229 lb)   LMP 12/23/2016   SpO2 100%   BMI 38.11 kg/m   Physical Exam  Constitutional: She is oriented to person, place, and time. She appears well-developed and well-nourished. No distress.  HENT:  Head: Normocephalic and atraumatic.  Cardiovascular: Normal rate, regular rhythm and normal heart sounds.   No  murmur heard. Pulmonary/Chest: Effort normal and breath sounds normal. No respiratory distress.  Abdominal: Soft. She exhibits no distension. There is no tenderness.  Musculoskeletal:  Erythema to posterior and medial aspects of the knee which is tender to palpation and warm to the touch. Decreased ROM 2/2 pain.  appreciated. No abnormal alignment or patellar mobility. Ligaments intact. No crepitus. 2+ DP pulses bilaterally. All compartments are soft. Sensation intact distal to injury.  Neurological: She is alert and oriented to person, place, and time.  Skin: Skin is warm and dry.  Nursing note and vitals reviewed.    ED Treatments / Results  Labs (all labs ordered are listed, but only abnormal results are displayed) Labs Reviewed - No data to display  EKG  EKG Interpretation None       Radiology Dg Knee Complete 4 Views Left  Result Date: 01/06/2017 CLINICAL DATA:  Left knee pain, swelling and redness. EXAM: LEFT KNEE - COMPLETE 4+ VIEW COMPARISON:  None. FINDINGS: No evidence of fracture, dislocation, or joint effusion. Mild medial tibiofemoral spurring without significant joint space narrowing. No bony destructive change. IMPRESSION: Early medial compartment degenerative change. No joint effusion or acute osseous abnormality. Electronically Signed   By: Rubye Oaks M.D.   On: 01/06/2017 03:00    Procedures Procedures (including critical care time)  Medications Ordered in ED Medications  doxycycline (VIBRA-TABS) tablet 100 mg (100 mg Oral Given 01/06/17 0232)     Initial Impression / Assessment and Plan / ED Course  I have reviewed the triage vital signs and the nursing notes.  Pertinent labs & imaging results that were available during my care of the patient were reviewed by me and considered in my medical decision making (see chart for details).    Brandy Beasley is a 36 y.o. female who presents to ED for erythema and warmth to left posterior and medial knee  which patient believes is 2/2 insect bite however did not specifically see insect. On exam, patient does have erythema/warmth to the area. No open wounds. Doubt septic joint. The erythema is more distal than I would expect and involved upper calf more so than the knee. X-ray negative. Likely infectious but given calf tenderness and swelling, recommended DVT U/S. Unfortunately DVT study unavailable at this hour. Order placed and instructions given to return for study in the am. Will treat with doxycyline. Home care and return precautions discussed. All questions answered.   Final Clinical Impressions(s) / ED Diagnoses   Final diagnoses:  Left leg cellulitis    New Prescriptions Discharge Medication List as of 01/06/2017  3:11 AM    START taking these medications   Details  doxycycline (VIBRAMYCIN) 100 MG capsule Take 1 capsule (100 mg total) by mouth 2 (two)  times daily., Starting Sun 01/06/2017, Print         Gwendlyon Zumbro, Chase Picket, PA-C 01/06/17 1610    Nicanor Alcon, April, MD 01/06/17 [id number]

## 2017-01-09 ENCOUNTER — Inpatient Hospital Stay (HOSPITAL_COMMUNITY)
Admission: EM | Admit: 2017-01-09 | Discharge: 2017-01-12 | DRG: 603 | Disposition: A | Discharge status: Home / Self Care | Payer: Medicaid Other | Attending: Family Medicine | Admitting: Family Medicine

## 2017-01-09 ENCOUNTER — Encounter (HOSPITAL_COMMUNITY): Payer: Self-pay

## 2017-01-09 ENCOUNTER — Emergency Department (HOSPITAL_COMMUNITY): Payer: Medicaid Other

## 2017-01-09 DIAGNOSIS — K219 Gastro-esophageal reflux disease without esophagitis: Secondary | ICD-10-CM

## 2017-01-09 DIAGNOSIS — L03116 Cellulitis of left lower limb: Secondary | ICD-10-CM | POA: Diagnosis not present

## 2017-01-09 DIAGNOSIS — D649 Anemia, unspecified: Secondary | ICD-10-CM | POA: Diagnosis present

## 2017-01-09 DIAGNOSIS — M25562 Pain in left knee: Secondary | ICD-10-CM

## 2017-01-09 DIAGNOSIS — F17210 Nicotine dependence, cigarettes, uncomplicated: Secondary | ICD-10-CM | POA: Diagnosis present

## 2017-01-09 DIAGNOSIS — L0390 Cellulitis, unspecified: Secondary | ICD-10-CM | POA: Diagnosis present

## 2017-01-09 DIAGNOSIS — R112 Nausea with vomiting, unspecified: Secondary | ICD-10-CM | POA: Diagnosis present

## 2017-01-09 HISTORY — DX: Cellulitis, unspecified: L03.90

## 2017-01-09 HISTORY — DX: Headache, unspecified: R51.9

## 2017-01-09 HISTORY — DX: Gastro-esophageal reflux disease without esophagitis: K21.9

## 2017-01-09 HISTORY — DX: Headache: R51

## 2017-01-09 LAB — CBC WITH DIFFERENTIAL/PLATELET
BASOS PCT: 0 %
Basophils Absolute: 0 10*3/uL (ref 0.0–0.1)
Eosinophils Absolute: 0.5 10*3/uL (ref 0.0–0.7)
Eosinophils Relative: 5 %
HCT: 41.4 % (ref 36.0–46.0)
Hemoglobin: 13.3 g/dL (ref 12.0–15.0)
Lymphocytes Relative: 30 %
Lymphs Abs: 3 10*3/uL (ref 0.7–4.0)
MCH: 28.7 pg (ref 26.0–34.0)
MCHC: 32.1 g/dL (ref 30.0–36.0)
MCV: 89.2 fL (ref 78.0–100.0)
MONO ABS: 0.6 10*3/uL (ref 0.1–1.0)
MONOS PCT: 6 %
Neutro Abs: 6 10*3/uL (ref 1.7–7.7)
Neutrophils Relative %: 59 %
Platelets: 211 10*3/uL (ref 150–400)
RBC: 4.64 MIL/uL (ref 3.87–5.11)
RDW: 13.7 % (ref 11.5–15.5)
WBC: 10.2 10*3/uL (ref 4.0–10.5)

## 2017-01-09 LAB — COMPREHENSIVE METABOLIC PANEL
ALBUMIN: 3.3 g/dL — AB (ref 3.5–5.0)
ALK PHOS: 60 U/L (ref 38–126)
ALT: 10 U/L — AB (ref 14–54)
AST: 18 U/L (ref 15–41)
Anion gap: 9 (ref 5–15)
BUN: 5 mg/dL — AB (ref 6–20)
CALCIUM: 9 mg/dL (ref 8.9–10.3)
CO2: 25 mmol/L (ref 22–32)
Chloride: 105 mmol/L (ref 101–111)
Creatinine, Ser: 0.77 mg/dL (ref 0.44–1.00)
GFR calc Af Amer: >60 mL/min (ref >60)
GFR calc non Af Amer: >60 mL/min (ref >60)
GLUCOSE: 114 mg/dL — AB (ref 65–99)
Potassium: 3.6 mmol/L (ref 3.5–5.1)
SODIUM: 139 mmol/L (ref 135–145)
Total Bilirubin: 0.5 mg/dL (ref 0.3–1.2)
Total Protein: 6 g/dL — ABNORMAL LOW (ref 6.5–8.1)

## 2017-01-09 LAB — I-STAT CG4 LACTIC ACID, ED: Lactic Acid, Venous: 1.45 mmol/L (ref 0.5–1.9)

## 2017-01-09 MED ORDER — SULFAMETHOXAZOLE-TRIMETHOPRIM 800-160 MG PO TABS: 1.0000 | ORAL_TABLET | Freq: Two times a day (BID) | ORAL | 0 refills | Status: DC

## 2017-01-09 MED ORDER — MORPHINE SULFATE (PF) 4 MG/ML IV SOLN
4.0000 mg | Freq: Once | INTRAVENOUS | Status: AC
  Administered 2017-01-09: 4 mg via INTRAVENOUS
  Filled 2017-01-09: qty 1

## 2017-01-09 MED ORDER — VANCOMYCIN HCL 10 G IV SOLR
2000.0000 mg | Freq: Once | INTRAVENOUS | Status: AC
  Administered 2017-01-09: 2000 mg via INTRAVENOUS
  Filled 2017-01-09: qty 2000

## 2017-01-09 MED ORDER — KETOROLAC TROMETHAMINE 15 MG/ML IJ SOLN
15.0000 mg | Freq: Once | INTRAMUSCULAR | Status: AC
  Administered 2017-01-09: 15 mg via INTRAVENOUS
  Filled 2017-01-09: qty 1

## 2017-01-09 NOTE — ED Triage Notes (Signed)
Patient complains of ongoing left leg pain since the weekend. Seen at [facility] and started on antibiotic and had negative U/S on Sunday. Request further evaluation for increased redness and pain

## 2017-01-09 NOTE — H&P (Addendum)
Brandy Beasley BJY:[id number] DOB: 12-18-1980 DOA: 01/09/2017     PCP: Patient, No Pcp Per   Outpatient Specialists: none Patient coming from:   home Lives  With family   Chief Complaint: left leg pain and swelling  HPI: Brandy Beasley is a 36 y.o. female with medical history significant of GERD, tobacco abuse    Presented with 4 day history of ongoing left leg pain and was seen in the emergency department. Days ago started on antibiotics and had an ultrasound done the Sunday that showed no evidence of DVT she was treated with doxycycline but continues to have pain and swelling Yesterday fever 102 and  Nausea with  Vomiting.  She had diarrhea for past 4 days but have stopped now Reports continues to smoke No chest pain no shortness of breath IN ER:  Temp (24hrs), Avg:99.1 F (37.3 C), Min:99 F (37.2 C), Max:99.1 F (37.3 C)      on arrival  ED Triage Vitals  Enc Vitals Group     BP 01/09/17 1538 136/89     Pulse Rate 01/09/17 1538 96     Resp 01/09/17 1538 18     Temp 01/09/17 1538 99.1 F (37.3 C)     Temp Source 01/09/17 1538 Oral     SpO2 01/09/17 1538 100 %     Weight 01/09/17 1949 229 lb (103.9 kg)     Height 01/09/17 1949  (1.651 m)     Head Circumference --      Peak Flow --      Pain Score 01/09/17 1536 8     Pain Loc --      Pain Edu? --      Excl. in GC? --     Latest RR 19 HR 92 BP 140/85 LA 1.45 Na 139 K 3.6 BUN 5 prot 6 alb 3.3 LFT's wnl WBC 10.2 13.3 PLT 211  Following Medications were ordered in ER: Medications  vancomycin (VANCOCIN) 2,000 mg in sodium chloride 0.9 % 500 mL IVPB (2,000 mg Intravenous New Bag/Given 01/09/17 2107)  ketorolac (TORADOL) 15 MG/ML injection 15 mg (15 mg Intravenous Given 01/09/17 2049)  morphine 4 MG/ML injection 4 mg (4 mg Intravenous Given 01/09/17 2107)     Hospitalist was called for admission for Cellulitis   Review of Systems:    Pertinent positives include: chills, fatigue,  Constitutional:  No  weight loss, night sweats, Fevers,  weight loss  HEENT:  No headaches, Difficulty swallowing,Tooth/dental problems,Sore throat,  No sneezing, itching, ear ache, nasal congestion, post nasal drip,  Cardio-vascular:  No chest pain, Orthopnea, PND, anasarca, dizziness, palpitations.no Bilateral lower extremity swelling  GI:  No heartburn, indigestion, abdominal pain, nausea, vomiting, diarrhea, change in bowel habits, loss of appetite, melena, blood in stool, hematemesis Resp:  no shortness of breath at rest. No dyspnea on exertion, No excess mucus, no productive cough, No non-productive cough, No coughing up of blood.No change in color of mucus.No wheezing. Skin:  no rash or lesions. No jaundice GU:  no dysuria, change in color of urine, no urgency or frequency. No straining to urinate.  No flank pain.  Musculoskeletal:  No joint pain or no joint swelling. No decreased range of motion. No back pain.  Psych:  No change in mood or affect. No depression or anxiety. No memory loss.  Neuro: no localizing neurological complaints, no tingling, no weakness, no double vision, no gait abnormality, no slurred speech, no confusion  As per HPI  otherwise 10 point review of systems negative.   Past Medical History: Past Medical History:  Diagnosis Date  . Medical history non-contributory   . Vaginal delivery 2002, 2006, 2015   Past Surgical History:  Procedure Laterality Date  . CHOLECYSTECTOMY    . LAPAROSCOPIC BILATERAL SALPINGECTOMY Bilateral 12/25/2013   Procedure: LAPAROSCOPIC BILATERAL SALPINGECTOMY;  Surgeon: Antionette Char, MD;  Location: [facility];  Service: Gynecology;  Laterality: Bilateral;     Social History:  Ambulatory   Independently     reports that she has been smoking Cigarettes.  She has been smoking about 0.25 packs per day. She has never used smokeless tobacco. She reports that she does not drink alcohol or use drugs.  Allergies:  No Known Allergies     Family  History:   Family History  Problem Relation Age of Onset  . Hypertension Mother   . HIV Father   . Diabetes Maternal Grandmother   . Hypertension Maternal Grandmother   . Cancer Maternal Grandfather   . Hypertension Other   . Diabetes Other   . Cancer Other     Medications: Prior to Admission medications   Medication Sig Start Date End Date Taking? Authorizing Provider  doxycycline (VIBRAMYCIN) 100 MG capsule Take 100 mg by mouth 2 (two) times daily. 01/06/17  Yes [provider]  ibuprofen (ADVIL,MOTRIN) 800 MG tablet Take 1 tablet (800 mg total) by mouth every 8 (eight) hours as needed. Patient not taking: Reported on 01/09/2017 12/25/13   Brock Bad, MD  oxyCODONE-acetaminophen (ROXICET) 5-325 MG per tablet Take 1-2 tablets by mouth every 4 (four) hours as needed for severe pain. Patient not taking: Reported on 01/09/2017 12/25/13   Brock Bad, MD  sulfamethoxazole-trimethoprim (BACTRIM DS,SEPTRA DS) 800-160 MG tablet Take 1 tablet by mouth 2 (two) times daily. 01/10/17 01/17/17  Hebert Soho, MD    Physical Exam: Patient Vitals for the past 24 hrs:  BP Temp Temp src Pulse Resp SpO2 Height Weight  01/09/17 1949 140/85 99 F (37.2 C) - 92 19 99 %  (1.651 m) 103.9 kg (229 lb)  01/09/17 1538 136/89 99.1 F (37.3 C) Oral 96 18 100 % - -    1. General:  in No Acute distress  well  -appearing 2. Psychological: Alert and   Oriented 3. Head/ENT:   Dry Mucous Membranes                          Head Non traumatic, neck supple                          Normal   Dentition 4. SKIN: normal   Skin turgor,  Skin clean Dry and intact redness and warmth over left leg      5. Heart: Regular rate and rhythm no  Murmur, no Rub or gallop 6. Lungs:  Clear to auscultation bilaterally, no wheezes or crackles   7. Abdomen: Soft,  non-tender, Non distended   Obese  8. Lower extremities: no clubbing, cyanosis, or edema 9. Neurologically Grossly intact, moving all 4  extremities equally  10. MSK: Normal range of motion   body mass index is 38.11 kg/m.  Labs on Admission:   Labs on Admission: I have personally reviewed following labs and imaging studies  CBC:  Recent Labs Lab 01/09/17 1543  WBC 10.2  NEUTROABS 6.0  HGB 13.3  HCT 41.4  MCV 89.2  PLT  211   Basic Metabolic Panel:  Recent Labs Lab 01/09/17 1543  NA 139  K 3.6  CL 105  CO2 25  GLUCOSE 114*  BUN 5*  CREATININE 0.77  CALCIUM 9.0   GFR: Estimated Creatinine Clearance: 116.3 mL/min (by C-G formula based on SCr of 0.77 mg/dL). Liver Function Tests:  Recent Labs Lab 01/09/17 1543  AST 18  ALT 10*  ALKPHOS 60  BILITOT 0.5  PROT 6.0*  ALBUMIN 3.3*   No results for input(s): LIPASE, AMYLASE in the last 168 hours. No results for input(s): AMMONIA in the last 168 hours. Coagulation Profile: No results for input(s): INR, PROTIME in the last 168 hours. Cardiac Enzymes: No results for input(s): CKTOTAL, CKMB, CKMBINDEX, TROPONINI in the last 168 hours. BNP (last 3 results) No results for input(s): PROBNP in the last 8760 hours. HbA1C: No results for input(s): HGBA1C in the last 72 hours. CBG: No results for input(s): GLUCAP in the last 168 hours. Lipid Profile: No results for input(s): CHOL, HDL, LDLCALC, TRIG, CHOLHDL, LDLDIRECT in the last 72 hours. Thyroid Function Tests: No results for input(s): TSH, T4TOTAL, FREET4, T3FREE, THYROIDAB in the last 72 hours. Anemia Panel: No results for input(s): VITAMINB12, FOLATE, FERRITIN, TIBC, IRON, RETICCTPCT in the last 72 hours. Urine analysis:  Sepsis Labs: (procalcitonin:4,lacticidven:4) )No results found for this or any previous visit (from the past 240 hour(s)).     UA not ordered  No results found for: HGBA1C  Estimated Creatinine Clearance: 116.3 mL/min (by C-G formula based on SCr of 0.77 mg/dL).  BNP (last 3 results) No results for input(s): PROBNP in the last 8760 hours.   ECG  REPORT Not ordered  [facility] Weights   01/09/17 1949  Weight: 103.9 kg (229 lb)     Cultures:    Component Value Date/Time   SDES URINE, CLEAN CATCH 04/06/2010 1125   SPECREQUEST NONE 04/06/2010 1125   CULT  04/06/2010 1125    Multiple bacterial morphotypes present, none predominant. Suggest appropriate recollection if clinically indicated.   REPTSTATUS 04/07/2010 FINAL 04/06/2010 1125     Radiological Exams on Admission: Dg Knee Complete 4 Views Left  Result Date: 01/09/2017 CLINICAL DATA:  Knee pain with cellulitis EXAM: LEFT KNEE - COMPLETE 4+ VIEW COMPARISON:  01/06/2017 FINDINGS: No fracture or malalignment. Minimal medial degenerative changes. No large knee effusion. Diffuse soft tissue swelling. IMPRESSION: No acute osseous abnormality Electronically Signed   By: Jasmine Pang M.D.   On: 01/09/2017 23:48    Chart has been reviewed    Assessment/Plan  36 y.o. female with medical history significant of GERD Admitted for Cellulitis of left leg  Present on Admission:  . Cellulitis - -admit per cellulitis protocol will       continue current antibiotic choice Roceohin      plain films showed:  no evidence of air no evidence of osteomyelitis   no               foreign   objects       tetanus shot 3 years ago      Will obtain MRSA screening,      obtain blood cultures if febrile or septic     further antibiotic adjustment pending above results Rehydrate given persistent nausea and vomiting . GERD without esophagitis - stable, chronic  Other plan as per orders.  DVT prophylaxis:  Lovenox     Code Status:  FULL CODE  as per patient   Family Communication:   Family  not  at  Bedside     Disposition Plan:    To home once workup is complete and patient is stable                                    Consults called: none    Admission status:    inpatient       Level of care       medical floor           I have spent a total of 56 min on this admission    Averianna Brugger 01/10/2017, 1:16 AM    [facility]  Pager [phone]   after 2 AM please page floor coverage PA If 7AM-7PM, please contact the day team taking care of the patient  [email or web address]  Password [email or web address]

## 2017-01-09 NOTE — ED Provider Notes (Signed)
[facility]-EMERGENCY DEPT Provider Note   CSN: [id number] Arrival date & time: 01/09/17  1524     History   Chief Complaint No chief complaint on file.   HPI Brandy Beasley is a 36 y.o. female.  31 yoF who p/w worsening erythema and pain of L posterior knee and thigh. Seen 3 days ago and diagnosed with cellulitis, started on doxycycline. Pt has noted intermittent fevers of 102 despite compliance with abx.  Has noted expanding erythematous. No swelling or crepitus. Continues to have sharp pain over erythema. Not taking other medications. She wonders if she was bitten by spider 1 weeks ago. Had negative DVT study several days ago. No previous history of these sx. No h/o DM.  The history is provided by the patient and medical records. No language interpreter was used.    Past Medical History:  Diagnosis Date  . Medical history non-contributory   . Vaginal delivery 2002, 2006, 2015    Patient Active Problem List   Diagnosis Date Noted  . Cellulitis 01/09/2017  . Indication for care in labor or delivery 10/20/2013  . Vaginal delivery 10/20/2013  . Vomiting and diarrhea 08/27/2013  . Headache 08/27/2013  . Supervision of other normal pregnancy 07/05/2013  . Tobacco use complicating pregnancy 07/05/2013  . Candidiasis of vulva and vagina 05/07/2013  . BV (bacterial vaginosis) 02/12/2013  . GERD without esophagitis 02/12/2013    Past Surgical History:  Procedure Laterality Date  . CHOLECYSTECTOMY    . LAPAROSCOPIC BILATERAL SALPINGECTOMY Bilateral 12/25/2013   Procedure: LAPAROSCOPIC BILATERAL SALPINGECTOMY;  Surgeon: Antionette Char, MD;  Location: [facility];  Service: Gynecology;  Laterality: Bilateral;    OB History    Gravida Para Term Preterm AB Living   SAB TAB Ectopic Multiple Live Births   Home Medications    Prior to Admission medications   Medication Sig Start Date End Date Taking? Authorizing Provider  doxycycline (VIBRAMYCIN)  100 MG capsule Take 100 mg by mouth 2 (two) times daily. 01/06/17  Yes [provider]  ibuprofen (ADVIL,MOTRIN) 800 MG tablet Take 1 tablet (800 mg total) by mouth every 8 (eight) hours as needed. Patient not taking: Reported on 01/09/2017 12/25/13   Brock Bad, MD  oxyCODONE-acetaminophen (ROXICET) 5-325 MG per tablet Take 1-2 tablets by mouth every 4 (four) hours as needed for severe pain. Patient not taking: Reported on 01/09/2017 12/25/13   Brock Bad, MD  sulfamethoxazole-trimethoprim (BACTRIM DS,SEPTRA DS) 800-160 MG tablet Take 1 tablet by mouth 2 (two) times daily. 01/10/17 01/17/17  Hebert Soho, MD    Family History Family History  Problem Relation Age of Onset  . Hypertension Mother   . HIV Father   . Diabetes Maternal Grandmother   . Hypertension Maternal Grandmother   . Cancer Maternal Grandfather   . Hypertension Other   . Diabetes Other   . Cancer Other     Social History Social History  Substance Use Topics  . Smoking status: Current Some Day Smoker    Packs/day: 0.25    Types: Cigarettes  . Smokeless tobacco: Never Used     Comment: states she is cutting back  . Alcohol use No     Allergies   Patient has no known allergies.   Review of Systems Review of Systems  Constitutional: Negative for chills and fever.  HENT: Negative for ear pain and sore  throat.   Eyes: Negative for pain and visual disturbance.  Respiratory: Negative for cough and shortness of breath.   Cardiovascular: Negative for chest pain and palpitations.  Gastrointestinal: Negative for abdominal pain and vomiting.  Genitourinary: Negative for dysuria and hematuria.  Musculoskeletal: Negative for arthralgias and back pain.  Skin: Positive for rash. Negative for color change.  Neurological: Negative for seizures and syncope.  All other systems reviewed and are negative.    Physical Exam Updated Vital Signs BP 140/85   Pulse 92   Temp 99 F (37.2 C)   Resp 19   Ht   (1.651 m)   Wt 103.9 kg (229 lb)   LMP 12/23/2016   SpO2 99%   BMI 38.11 kg/m   Physical Exam  Constitutional: She appears well-developed.  HENT:  Head: Normocephalic and atraumatic.  Eyes: Conjunctivae are normal.  Neck: Neck supple.  Cardiovascular: Normal rate and regular rhythm.   No murmur heard. Pulmonary/Chest: Effort normal and breath sounds normal. No respiratory distress.  Abdominal: Soft. There is no tenderness.  Musculoskeletal: She exhibits no edema.  Full ROM of L knee without pain  Neurological: She is alert. No cranial nerve deficit. Coordination normal.  5/5 motor strength and intact sensation in all extremities. Finger-to-nose intact bilaterally  Skin: Skin is warm and dry. There is erythema.  Moderate size erythema over L posterior thigh and knee. Pain over affected site. No crepitus, fluctuance, or induration.  Nursing note and vitals reviewed.    ED Treatments / Results  Labs (all labs ordered are listed, but only abnormal results are displayed) Labs Reviewed  COMPREHENSIVE METABOLIC PANEL - Abnormal; Notable for the following:       Result Value   Glucose, Bld 114 (*)    BUN 5 (*)    Total Protein 6.0 (*)    Albumin 3.3 (*)    ALT 10 (*)    All other components within normal limits  CULTURE, BLOOD (ROUTINE X 2)  CULTURE, BLOOD (ROUTINE X 2)  CBC WITH DIFFERENTIAL/PLATELET  I-STAT CG4 LACTIC ACID, ED  I-STAT CG4 LACTIC ACID, ED    EKG  EKG Interpretation None       Radiology Dg Knee Complete 4 Views Left  Result Date: 01/09/2017 CLINICAL DATA:  Knee pain with cellulitis EXAM: LEFT KNEE - COMPLETE 4+ VIEW COMPARISON:  01/06/2017 FINDINGS: No fracture or malalignment. Minimal medial degenerative changes. No large knee effusion. Diffuse soft tissue swelling. IMPRESSION: No acute osseous abnormality Electronically Signed   By: Jasmine Pang M.D.   On: 01/09/2017 23:48    Procedures Procedures (including critical care  time)  Medications Ordered in ED Medications  vancomycin (VANCOCIN) 2,000 mg in sodium chloride 0.9 % 500 mL IVPB (0 mg Intravenous Stopped 01/09/17 2359)  ketorolac (TORADOL) 15 MG/ML injection 15 mg (15 mg Intravenous Given 01/09/17 2049)  morphine 4 MG/ML injection 4 mg (4 mg Intravenous Given 01/09/17 2107)     Initial Impression / Assessment and Plan / ED Course  I have reviewed the triage vital signs and the nursing notes.  Pertinent labs & imaging results that were available during my care of the patient were reviewed by me and considered in my medical decision making (see chart for details).     85 yoF who p/w L posterior knee and thigh erythema concerning for cellulitis. Worsening while on doxycycline. AF, VSS. Tender over erythema. No palpable crepitus, fluctuance, or induration.  Likely cellulitis failing o/p doxycyline. Doubt nec fasc or  septic joint. Lactic acid 1.45. CBC, CMP unremarkable.   Vancomycin given. Pain persistent despite toradol and morphine. Pt electing to be admitted for continued abx and monitoring. Pt stable at time of transfer.  Pt care d/w Dr. Particia Nearing  Final Clinical Impressions(s) / ED Diagnoses   Final diagnoses:  Cellulitis of left lower extremity    New Prescriptions New Prescriptions   SULFAMETHOXAZOLE-TRIMETHOPRIM (BACTRIM DS,SEPTRA DS) 800-160 MG TABLET    Take 1 tablet by mouth 2 (two) times daily.     Hebert Soho, MD 01/10/17 1610    Jacalyn Lefevre, MD 01/10/17 2258

## 2017-01-10 ENCOUNTER — Encounter (HOSPITAL_COMMUNITY): Payer: Self-pay | Admitting: Internal Medicine

## 2017-01-10 DIAGNOSIS — K219 Gastro-esophageal reflux disease without esophagitis: Secondary | ICD-10-CM | POA: Diagnosis present

## 2017-01-10 DIAGNOSIS — F17210 Nicotine dependence, cigarettes, uncomplicated: Secondary | ICD-10-CM | POA: Diagnosis present

## 2017-01-10 DIAGNOSIS — L03116 Cellulitis of left lower limb: Secondary | ICD-10-CM | POA: Diagnosis present

## 2017-01-10 DIAGNOSIS — R112 Nausea with vomiting, unspecified: Secondary | ICD-10-CM | POA: Diagnosis present

## 2017-01-10 DIAGNOSIS — D649 Anemia, unspecified: Secondary | ICD-10-CM

## 2017-01-10 LAB — CBC
HCT: 34.3 % — ABNORMAL LOW (ref 36.0–46.0)
Hemoglobin: 10.9 g/dL — ABNORMAL LOW (ref 12.0–15.0)
MCH: 28.7 pg (ref 26.0–34.0)
MCHC: 31.8 g/dL (ref 30.0–36.0)
MCV: 90.3 fL (ref 78.0–100.0)
PLATELETS: 160 10*3/uL (ref 150–400)
RBC: 3.8 MIL/uL — AB (ref 3.87–5.11)
RDW: 14.1 % (ref 11.5–15.5)
WBC: 7.3 10*3/uL (ref 4.0–10.5)

## 2017-01-10 LAB — COMPREHENSIVE METABOLIC PANEL
ALK PHOS: 52 U/L (ref 38–126)
ALT: 21 U/L (ref 14–54)
AST: 41 U/L (ref 15–41)
Albumin: 2.8 g/dL — ABNORMAL LOW (ref 3.5–5.0)
Anion gap: 8 (ref 5–15)
BUN: 9 mg/dL (ref 6–20)
CALCIUM: 8.1 mg/dL — AB (ref 8.9–10.3)
CO2: 26 mmol/L (ref 22–32)
CREATININE: 0.82 mg/dL (ref 0.44–1.00)
Chloride: 104 mmol/L (ref 101–111)
GFR calc non Af Amer: >60 mL/min (ref >60)
GLUCOSE: 112 mg/dL — AB (ref 65–99)
Potassium: 3.4 mmol/L — ABNORMAL LOW (ref 3.5–5.1)
SODIUM: 138 mmol/L (ref 135–145)
Total Bilirubin: 0.5 mg/dL (ref 0.3–1.2)
Total Protein: 5.4 g/dL — ABNORMAL LOW (ref 6.5–8.1)

## 2017-01-10 LAB — HEMOGLOBIN A1C
HEMOGLOBIN A1C: 5.5 % (ref 4.8–5.6)
Mean Plasma Glucose: 111.15 mg/dL

## 2017-01-10 LAB — MAGNESIUM: Magnesium: 1.8 mg/dL (ref 1.7–2.4)

## 2017-01-10 LAB — PREGNANCY, URINE: Preg Test, Ur: NEGATIVE

## 2017-01-10 LAB — PHOSPHORUS: Phosphorus: 3.9 mg/dL (ref 2.5–4.6)

## 2017-01-10 LAB — HIV ANTIBODY (ROUTINE TESTING W REFLEX): HIV SCREEN 4TH GENERATION: NONREACTIVE

## 2017-01-10 LAB — MRSA PCR SCREENING: MRSA by PCR: NEGATIVE

## 2017-01-10 MED ORDER — HYDROCODONE-ACETAMINOPHEN 5-325 MG PO TABS
1.0000 | ORAL_TABLET | ORAL | Status: DC | PRN
  Administered 2017-01-10: 1 via ORAL
  Administered 2017-01-10 (×2): 2 via ORAL
  Administered 2017-01-10: 1 via ORAL
  Filled 2017-01-10: qty 2
  Filled 2017-01-10: qty 1
  Filled 2017-01-10: qty 2
  Filled 2017-01-10: qty 1
  Filled 2017-01-10 (×2): qty 2

## 2017-01-10 MED ORDER — SODIUM CHLORIDE 0.9 % IV SOLN
INTRAVENOUS | Status: AC
  Administered 2017-01-10: 03:00:00 via INTRAVENOUS

## 2017-01-10 MED ORDER — ENOXAPARIN SODIUM 40 MG/0.4ML ~~LOC~~ SOLN
40.0000 mg | Freq: Every day | SUBCUTANEOUS | Status: DC
  Administered 2017-01-10: 40 mg via SUBCUTANEOUS
  Filled 2017-01-10: qty 0.4

## 2017-01-10 MED ORDER — ACETAMINOPHEN 650 MG RE SUPP: 650.0000 mg | Freq: Four times a day (QID) | RECTAL | Status: DC | PRN

## 2017-01-10 MED ORDER — ONDANSETRON HCL 4 MG PO TABS: 4.0000 mg | ORAL_TABLET | Freq: Four times a day (QID) | ORAL | Status: DC | PRN

## 2017-01-10 MED ORDER — ENOXAPARIN SODIUM 60 MG/0.6ML ~~LOC~~ SOLN
50.0000 mg | Freq: Every day | SUBCUTANEOUS | Status: DC
  Administered 2017-01-11 – 2017-01-12 (×2): 50 mg via SUBCUTANEOUS
  Filled 2017-01-10 (×2): qty 0.6

## 2017-01-10 MED ORDER — ACETAMINOPHEN 325 MG PO TABS
650.0000 mg | ORAL_TABLET | Freq: Four times a day (QID) | ORAL | Status: DC | PRN
  Administered 2017-01-10 – 2017-01-12 (×4): 650 mg via ORAL
  Filled 2017-01-10 (×5): qty 2

## 2017-01-10 MED ORDER — ONDANSETRON HCL 4 MG/2ML IJ SOLN: 4.0000 mg | Freq: Four times a day (QID) | INTRAMUSCULAR | Status: DC | PRN

## 2017-01-10 MED ORDER — ENOXAPARIN SODIUM 80 MG/0.8ML ~~LOC~~ SOLN: 80.0000 mg | Freq: Every day | SUBCUTANEOUS | Status: DC

## 2017-01-10 MED ORDER — DEXTROSE 5 % IV SOLN
2.0000 g | Freq: Every day | INTRAVENOUS | Status: DC
  Administered 2017-01-10 – 2017-01-11 (×3): 2 g via INTRAVENOUS
  Filled 2017-01-10 (×3): qty 2

## 2017-01-10 NOTE — Progress Notes (Signed)
  PROGRESS NOTE  AUDYN DIMERCURIO ZOX:[id number] DOB: 15-Mar-1981 DOA: 01/09/2017 PCP: Patient, No Pcp Per  Brief Narrative: 36 year old woman with no significant PMH, presented with increasing left lower extremity pain and erythema despite outpatient treatment with doxycycline and negative venous ultrasound on previous visit. Presented with fever and was admitted for left leg cellulitis.  Assessment/Plan Left lower leg cellulitis failed outpatient treatment with doxycycline  -continue empiric ceftriaxone given clinical improvement -screening HIV in process  Normocytic anemia -check CBC in AM  DVT prophylaxis: enoxaparin Code Status: full Family Communication: none Disposition Plan: home    [name], MD  [facility] Direct contact: [phone] --Via amion app OR  --[email or web address]; password [email or web address]  7PM-7AM contact night coverage as above 01/10/2017, 11:53 AM  LOS: 0 days   Consultants:    Procedures:    Antimicrobials:  Ceftriaxone 10/4 >>  Vancomycin 10/3 >>  Interval history/Subjective: Left leg less swollen but still very pain with movement.  Objective: Vitals: afebrile, 98.6, 16, 63, 107/64, 100% on RA  Exam:     Constitutional: appears calm and comfortable  Resp: CTA bilaterally no w/r/r. Normal resp effort  CV: RRR no m/r/g. No RLE edema. 2+ LLE edema.  Skin: left foot and calf unremarkable. 1+ DP pulse on left. Erythema medial calf extending proximal to distal thigh, medially only; no anterior, lateral or posterior erythema. Knee appears normal, no effusion, nontender. ROM limited by pain medially, no pain in knee joint. No abscess or break in skin.  Psych: grossly normal mood and affect, speech fluent and appropriate   I have personally reviewed the following:   Labs:  Complete metabolic panel unremarkable, hemoglobin down to 10.9, WBC within normal limits.  Hemoglobin A1c negative  Imaging studies:  Knee x-ray no acute  abnormalities.  Medical tests:  EKG SB, no acute changes   Scheduled Meds: . [START ON 01/11/2017] enoxaparin (LOVENOX) injection  50 mg Subcutaneous Daily   Continuous Infusions: . sodium chloride 100 mL/hr at 01/10/17 0257  . cefTRIAXone (ROCEPHIN)  IV Stopped (01/10/17 0410)    Active Problems:   GERD without esophagitis   Cellulitis   Normocytic anemia   LOS: 0 days

## 2017-01-11 DIAGNOSIS — L03116 Cellulitis of left lower limb: Secondary | ICD-10-CM

## 2017-01-11 LAB — CBC
HCT: 35.9 % — ABNORMAL LOW (ref 36.0–46.0)
HEMOGLOBIN: 11.2 g/dL — AB (ref 12.0–15.0)
MCH: 28.2 pg (ref 26.0–34.0)
MCHC: 31.2 g/dL (ref 30.0–36.0)
MCV: 90.4 fL (ref 78.0–100.0)
PLATELETS: 182 10*3/uL (ref 150–400)
RBC: 3.97 MIL/uL (ref 3.87–5.11)
RDW: 13.7 % (ref 11.5–15.5)
WBC: 7.3 10*3/uL (ref 4.0–10.5)

## 2017-01-11 NOTE — Progress Notes (Signed)
  PROGRESS NOTE  Brandy Beasley FAO:[id number] DOB: Jan 14, 1981 DOA: 01/09/2017 PCP: Patient, No Pcp Per  Brief Narrative: 36 year old woman with no significant PMH, presented with increasing left lower extremity pain and erythema despite outpatient treatment with doxycycline and negative venous ultrasound on previous visit. Presented with fever and was admitted for left leg cellulitis.  Assessment/Plan Left lower leg cellulitis failed outpatient treatment with doxycycline  -slowly improving with empiric ceftriaxone.continue current regimen. Not yet ready for discharge given amount of edema, pain  Normocytic anemia -stable. Minimal. Follow-up as an outpatient.  DVT prophylaxis: enoxaparin Code Status: full Family Communication: none Disposition Plan: home    Brandy Sacks, MD  [facility] Direct contact: [phone] --Via amion app OR  --[email or web address]; password [email or web address]  7PM-7AM contact night coverage as above 01/11/2017, 10:34 AM  LOS: 1 day   Consultants:    Procedures:    Antimicrobials:  Ceftriaxone 10/4 >>  Vancomycin 10/3 >>  Interval history/Subjective: Still pain in medial left leg, no pain in lower leg or foot or proximal thigh.  Objective: Vitals: afebrile, VSS, 97.7, 16. 55. 117/78, 100% on RA  Exam:    Constitutional:  Appears calm and comfortable ENMT:  grossly normal hearing Respiratory:  CTA bilaterally, no w/r/r.  Respiratory effort normal.  Cardiovascular:  RRR, no m/r/g No RLE extremity edema    Dorsalis pedis pulse2+ on the left.  Left lower extremity edema mildly improved in the left thigh. No significant change to edema of the left calf. Musculoskeletal:  Left lower extremity strength and tone normal Left medial leg tender especially in the area of the Upper calf. No tenderness of the lower leg or proximal thigh. Range of movement limited by pain but appears grossly normal.  Skin:  Decreasing erythema of the left  leg. palpation of skin: no induration or nodules but left calf is tender to palpation. No fluctuance noted. Psychiatric:  judgement and insight appear normal Mental status Mood, affect appropriate     I have personally reviewed the following:   Labs:  CBC unremarkable. Hemoglobin stable 11.2.  Urine pregnancy negative  Screening HIV negative  Imaging studies:    Medical tests:    Scheduled Meds: . enoxaparin (LOVENOX) injection  50 mg Subcutaneous Daily   Continuous Infusions: . cefTRIAXone (ROCEPHIN)  IV Stopped (01/10/17 2127)    Principal Problem:   Cellulitis of left leg without foot Active Problems:   Normocytic anemia   LOS: 1 day

## 2017-01-12 MED ORDER — CEFUROXIME AXETIL 500 MG PO TABS: 500.0000 mg | ORAL_TABLET | Freq: Two times a day (BID) | ORAL | 0 refills | Status: AC

## 2017-01-12 NOTE — Progress Notes (Signed)
Pt asked the nurse to have "off-unit-privileges". RN paged MD and was told by MD that "the patient can go outside only with RN or NT". The patient left the unit  despite this recommendation without RN/NT. When pt came back she was advised to not leave the floor without doctor's consent.

## 2017-01-12 NOTE — Discharge Summary (Signed)
Physician Discharge Summary  DILANA MCPHIE WUJ:[id number] DOB: 1981/01/29 DOA: 01/09/2017  PCP: Patient, No Pcp Per  Admit date: 01/09/2017 Discharge date: 01/12/2017  Recommendations for Outpatient Follow-up:  1. Resolution of LLE cellulitis 2. Further evaluation of normocytic anemia   Follow-up Information    [facility] Follow up.   Why:  PCP followup Contact information: [address] [place] [address] [phone]           Discharge Diagnoses:  1. Left lower leg cellulitis 2. Normocytic anemia  Discharge Condition: improved Disposition: home  Diet recommendation: regular  Filed Weights   01/09/17 1949  Weight: 103.9 kg (229 lb)    History of present illness:  36 year old woman with no significant PMH, presented with increasing left lower extremity pain and erythema despite outpatient treatment with doxycycline and negative venous ultrasound on previous visit. Presented with fever and was admitted for left leg cellulitis.  Hospital Course:  Patient was treated with empiric ceftriaxone with gradual clinical improvement. Hospitalization was uncomplicated. Transitioned to Ceftin on discharge.  Left lower leg cellulitis failed outpatient treatment with doxycycline  -much improved on ceftriaxone -expect resolution on oral abx  Normocytic anemia -stable. Recommend outpatient follow-up.   Antimicrobials:  Ceftriaxone 10/4 >> 10/5  Ceftin 10/5 >> 10/10  Vancomycin 10/3   Today's assessment: S: feels much better, less edema, less pain O: Vitals: afebrile, VSS, 98.3, 55, 120/69, 100% on RA  Constitutional:  Appears calm and comfortable ENMT:  grossly normal hearing  Respiratory:  CTA bilaterally, no w/r/r.  Respiratory effort normal.  Cardiovascular:  RRR, no m/r/g Musculoskeletal:  LLE tone normal, no atrophy, no abnormal movements Minimal tenderness Skin:  LLE erythema appears resolved,  edema decreasing less pain noted Psychiatric:  Mental status Mood, affect appropriate   CBC: normal WBC. Hgb stable 11.2.   Discharge Instructions  Discharge Instructions    Activity as tolerated - No restrictions    Complete by:  As directed    Diet general    Complete by:  As directed    Discharge instructions    Complete by:  As directed    Call your physician or seek immediate medical attention for pain, swelling, redness or worsening of condition.     Allergies as of 01/12/2017   No Known Allergies     Medication List    STOP taking these medications   doxycycline 100 MG capsule Commonly known as:  VIBRAMYCIN   ibuprofen 800 MG tablet Commonly known as:  ADVIL,MOTRIN   oxyCODONE-acetaminophen 5-325 MG tablet Commonly known as:  ROXICET     TAKE these medications   cefUROXime 500 MG tablet Commonly known as:  CEFTIN Take 1 tablet (500 mg total) by mouth 2 (two) times daily.      No Known Allergies  The results of significant diagnostics from this hospitalization (including imaging, microbiology, ancillary and laboratory) are listed below for reference.    Significant Diagnostic Studies: Dg Knee Complete 4 Views Left  Result Date: 01/09/2017 CLINICAL DATA:  Knee pain with cellulitis EXAM: LEFT KNEE - COMPLETE 4+ VIEW COMPARISON:  01/06/2017 FINDINGS: No fracture or malalignment. Minimal medial degenerative changes. No large knee effusion. Diffuse soft tissue swelling. IMPRESSION: No acute osseous abnormality Electronically Signed   By: Jasmine Pang M.D.   On: 01/09/2017 23:48   Dg Knee Complete 4 Views Left  Result Date: 01/06/2017 CLINICAL DATA:  Left knee pain, swelling and redness. EXAM: LEFT KNEE - COMPLETE 4+  VIEW COMPARISON:  None. FINDINGS: No evidence of fracture, dislocation, or joint effusion. Mild medial tibiofemoral spurring without significant joint space narrowing. No bony destructive change. IMPRESSION: Early medial compartment degenerative  change. No joint effusion or acute osseous abnormality. Electronically Signed   By: Rubye Oaks M.D.   On: 01/06/2017 03:00    Microbiology: Recent Results (from the past 240 hour(s))  Blood culture (routine x 2)     Status: None (Preliminary result)   Collection Time: 01/09/17 10:20 PM  Result Value Ref Range Status   Specimen Description BLOOD BLOOD LEFT FOREARM  Final   Special Requests IN PEDIATRIC BOTTLE Blood Culture adequate volume  Final   Culture NO GROWTH 1 DAY  Final   Report Status PENDING  Incomplete  Blood culture (routine x 2)     Status: None (Preliminary result)   Collection Time: 01/09/17 10:57 PM  Result Value Ref Range Status   Specimen Description BLOOD RIGHT ANTECUBITAL  Final   Special Requests   Final    BOTTLES DRAWN AEROBIC AND ANAEROBIC Blood Culture adequate volume   Culture NO GROWTH 1 DAY  Final   Report Status PENDING  Incomplete  MRSA PCR Screening     Status: None   Collection Time: 01/10/17  2:45 AM  Result Value Ref Range Status   MRSA by PCR NEGATIVE NEGATIVE Final    Comment:        The GeneXpert MRSA Assay (FDA approved for NASAL specimens only), is one component of a comprehensive MRSA colonization surveillance program. It is not intended to diagnose MRSA infection nor to guide or monitor treatment for MRSA infections.      Labs: Basic Metabolic Panel:  Recent Labs Lab 01/09/17 1543 01/10/17 0450  NA 139 138  K 3.6 3.4*  CL 105 104  CO2 25 26  GLUCOSE 114* 112*  BUN 5* 9  CREATININE 0.77 0.82  CALCIUM 9.0 8.1*  MG  --  1.8  PHOS  --  3.9   Liver Function Tests:  Recent Labs Lab 01/09/17 1543 01/10/17 0450  AST 18 41  ALT 10* 21  ALKPHOS 60 52  BILITOT 0.5 0.5  PROT 6.0* 5.4*  ALBUMIN 3.3* 2.8*   CBC:  Recent Labs Lab 01/09/17 1543 01/10/17 0450 01/11/17 0558  WBC 10.2 7.3 7.3  NEUTROABS 6.0  --   --   HGB 13.3 10.9* 11.2*  HCT 41.4 34.3* 35.9*  MCV 89.2 90.3 90.4  PLT 211 160 182     Principal Problem:   Cellulitis of left leg without foot Active Problems:   Normocytic anemia   Time coordinating discharge: 25 minutes  Signed:  [name], MD [facility] 01/12/2017, 11:55 AM

## 2017-01-12 NOTE — Progress Notes (Signed)
Patient discharged from unit via pvt auto to home with family. All personal belongings with pt. Pt demonstrates no c/o discomfort or distress at time of d/c. Pt reminded to contact MD office Monday for follow up appt.

## 2017-01-15 LAB — CULTURE, BLOOD (ROUTINE X 2)
Culture: NO GROWTH
Culture: NO GROWTH
SPECIAL REQUESTS: ADEQUATE
SPECIAL REQUESTS: ADEQUATE

## 2019-02-16 ENCOUNTER — Other Ambulatory Visit: Payer: Self-pay

## 2019-02-16 DIAGNOSIS — Z20822 Contact with and (suspected) exposure to covid-19: Secondary | ICD-10-CM

## 2019-02-17 LAB — NOVEL CORONAVIRUS, NAA: SARS-CoV-2, NAA: NOT DETECTED

## 2019-02-28 ENCOUNTER — Other Ambulatory Visit: Payer: Self-pay

## 2019-02-28 DIAGNOSIS — Z20822 Contact with and (suspected) exposure to covid-19: Secondary | ICD-10-CM

## 2019-03-02 LAB — NOVEL CORONAVIRUS, NAA: SARS-CoV-2, NAA: NOT DETECTED

## 2021-09-28 ENCOUNTER — Encounter (HOSPITAL_COMMUNITY): Payer: Self-pay | Admitting: Emergency Medicine

## 2021-09-28 ENCOUNTER — Emergency Department (HOSPITAL_COMMUNITY): Payer: 59

## 2021-09-28 ENCOUNTER — Emergency Department (HOSPITAL_COMMUNITY)
Admission: EM | Admit: 2021-09-28 | Discharge: 2021-09-28 | Disposition: A | Discharge status: Home / Self Care | Payer: 59 | Attending: Emergency Medicine | Admitting: Emergency Medicine

## 2021-09-28 DIAGNOSIS — Z20822 Contact with and (suspected) exposure to covid-19: Secondary | ICD-10-CM | POA: Diagnosis not present

## 2021-09-28 DIAGNOSIS — J029 Acute pharyngitis, unspecified: Secondary | ICD-10-CM | POA: Insufficient documentation

## 2021-09-28 DIAGNOSIS — R059 Cough, unspecified: Secondary | ICD-10-CM | POA: Diagnosis not present

## 2021-09-28 LAB — RESP PANEL BY RT-PCR (FLU A&B, COVID) ARPGX2
Influenza A by PCR: NEGATIVE
Influenza B by PCR: NEGATIVE
SARS Coronavirus 2 by RT PCR: NEGATIVE

## 2021-09-28 LAB — GROUP A STREP BY PCR: Group A Strep by PCR: NOT DETECTED

## 2021-09-28 MED ORDER — KETOROLAC TROMETHAMINE 30 MG/ML IJ SOLN
30.0000 mg | Freq: Once | INTRAMUSCULAR | Status: AC
  Administered 2021-09-28: 30 mg via INTRAMUSCULAR
  Filled 2021-09-28: qty 1

## 2021-09-28 MED ORDER — DOXYCYCLINE HYCLATE 100 MG PO TABS
100.0000 mg | ORAL_TABLET | Freq: Once | ORAL | Status: AC
  Administered 2021-09-28: 100 mg via ORAL
  Filled 2021-09-28: qty 1

## 2021-09-28 MED ORDER — AEROCHAMBER Z-STAT PLUS/MEDIUM MISC
1.0000 | Freq: Once | Status: AC
  Administered 2021-09-28: 1
  Filled 2021-09-28: qty 1

## 2021-09-28 MED ORDER — ALBUTEROL SULFATE HFA 108 (90 BASE) MCG/ACT IN AERS
2.0000 | INHALATION_SPRAY | RESPIRATORY_TRACT | Status: DC | PRN
  Administered 2021-09-28: 2 via RESPIRATORY_TRACT
  Filled 2021-09-28: qty 6.7

## 2021-09-28 MED ORDER — DOXYCYCLINE HYCLATE 100 MG PO CAPS: 100.0000 mg | ORAL_CAPSULE | Freq: Two times a day (BID) | ORAL | 0 refills | Status: DC

## 2021-09-28 MED ORDER — LIDOCAINE VISCOUS HCL 2 % MT SOLN
15.0000 mL | Freq: Once | OROMUCOSAL | Status: AC
  Administered 2021-09-28: 15 mL via OROMUCOSAL
  Filled 2021-09-28: qty 15

## 2021-09-28 NOTE — ED Triage Notes (Signed)
Patient here from home reporting sore throat since last Wed.

## 2022-01-02 ENCOUNTER — Telehealth: Payer: 59 | Admitting: Physician Assistant

## 2022-01-02 ENCOUNTER — Encounter: Payer: Self-pay | Admitting: Physician Assistant

## 2022-01-02 DIAGNOSIS — Z20822 Contact with and (suspected) exposure to covid-19: Secondary | ICD-10-CM

## 2022-01-02 MED ORDER — BENZONATATE 100 MG PO CAPS: 100.0000 mg | ORAL_CAPSULE | Freq: Three times a day (TID) | ORAL | 0 refills | Status: DC | PRN

## 2022-01-02 MED ORDER — COVID-19 AT HOME ANTIGEN TEST VI KIT: PACK | 0 refills | Status: DC

## 2022-01-02 MED ORDER — ALBUTEROL SULFATE HFA 108 (90 BASE) MCG/ACT IN AERS: 2.0000 | INHALATION_SPRAY | Freq: Four times a day (QID) | RESPIRATORY_TRACT | 0 refills | Status: DC | PRN

## 2022-01-02 NOTE — Patient Instructions (Addendum)
Brandy Beasley, thank you for joining Piedad Climes, PA-C for today's virtual visit.  While this provider is not your primary care provider (PCP), if your PCP is located in our provider database this encounter information will be shared with them immediately following your visit.  Consent: (Patient) Brandy Beasley provided verbal consent for this virtual visit at the beginning of the encounter.  Current Medications:  Current Outpatient Medications:    doxycycline (VIBRAMYCIN) 100 MG capsule, Take 1 capsule (100 mg total) by mouth 2 (two) times daily., Disp: 14 capsule, Rfl: 0   Medications ordered in this encounter:  No orders of the defined types were placed in this encounter.    *If you need refills on other medications prior to your next appointment, please contact your pharmacy*  Follow-Up: Call back or seek an in-person evaluation if the symptoms worsen or if the condition fails to improve as anticipated.  [facility] [phone]  Other Instructions Repeat home COVID testing and message as soon as results are available.   Please keep well-hydrated and get plenty of rest. Start a saline nasal rinse to flush out your nasal passages. You can use plain Mucinex to help thin congestion. If you have a humidifier, running in the bedroom at night. I want you to start OTC vitamin D3 1000 units daily, vitamin C 1000 mg daily, and a zinc supplement. Please take prescribed medications as directed.  You have been enrolled in a MyChart symptom monitoring program. Please answer these questions daily so we can keep track of how you are doing.  If you note any worsening of symptoms, any significant shortness of breath or any chest pain, please seek ER evaluation ASAP.  Please do not delay care!  COVID-19: What to Do if You Are Sick If you test positive and are an older adult or someone who is at high risk of getting very sick from COVID-19, treatment may be  available. Contact a healthcare provider right away after a positive test to determine if you are eligible, even if your symptoms are mild right now. You can also visit a Test to Treat location and, if eligible, receive a prescription from a provider. Don't delay: Treatment must be started within the first few days to be effective. If you have a fever, cough, or other symptoms, you might have COVID-19. Most people have mild illness and are able to recover at home. If you are sick: Keep track of your symptoms. If you have an emergency warning sign (including trouble breathing), call 911. Steps to help prevent the spread of COVID-19 if you are sick If you are sick with COVID-19 or think you might have COVID-19, follow the steps below to care for yourself and to help protect other people in your home and community. Stay home except to get medical care Stay home. Most people with COVID-19 have mild illness and can recover at home without medical care. Do not leave your home, except to get medical care. Do not visit public areas and do not go to places where you are unable to wear a mask. Take care of yourself. Get rest and stay hydrated. Take over-the-counter medicines, such as acetaminophen, to help you feel better. Stay in touch with your doctor. Call before you get medical care. Be sure to get care if you have trouble breathing, or have any other emergency warning signs, or if you think it is an emergency. Avoid public transportation, ride-sharing, or taxis if possible.  Get tested If you have symptoms of COVID-19, get tested. While waiting for test results, stay away from others, including staying apart from those living in your household. Get tested as soon as possible after your symptoms start. Treatments may be available for people with COVID-19 who are at risk for becoming very sick. Don't delay: Treatment must be started early to be effective--some treatments must begin within 5 days of your first  symptoms. Contact your healthcare provider right away if your test result is positive to determine if you are eligible. Self-tests are one of several options for testing for the virus that causes COVID-19 and may be more convenient than laboratory-based tests and point-of-care tests. Ask your healthcare provider or your local health department if you need help interpreting your test results. You can visit your state, tribal, local, and territorial health department's website to look for the latest local information on testing sites. Separate yourself from other people As much as possible, stay in a specific room and away from other people and pets in your home. If possible, you should use a separate bathroom. If you need to be around other people or animals in or outside of the home, wear a well-fitting mask. Tell your close contacts that they may have been exposed to COVID-19. An infected person can spread COVID-19 starting 48 hours (or 2 days) before the person has any symptoms or tests positive. By letting your close contacts know they may have been exposed to COVID-19, you are helping to protect everyone. See COVID-19 and Animals if you have questions about pets. If you are diagnosed with COVID-19, someone from the health department may call you. Answer the call to slow the spread. Monitor your symptoms Symptoms of COVID-19 include fever, cough, or other symptoms. Follow care instructions from your healthcare provider and local health department. Your local health authorities may give instructions on checking your symptoms and reporting information. When to seek emergency medical attention Look for emergency warning signs* for COVID-19. If someone is showing any of these signs, seek emergency medical care immediately: Trouble breathing Persistent pain or pressure in the chest New confusion Inability to wake or stay awake Pale, gray, or blue-colored skin, lips, or nail beds, depending on skin  tone *This list is not all possible symptoms. Please call your medical provider for any other symptoms that are severe or concerning to you. Call 911 or call ahead to your local emergency facility: Notify the operator that you are seeking care for someone who has or may have COVID-19. Call ahead before visiting your doctor Call ahead. Many medical visits for routine care are being postponed or done by phone or telemedicine. If you have a medical appointment that cannot be postponed, call your doctor's office, and tell them you have or may have COVID-19. This will help the office protect themselves and other patients. If you are sick, wear a well-fitting mask You should wear a mask if you must be around other people or animals, including pets (even at home). Wear a mask with the best fit, protection, and comfort for you. You don't need to wear the mask if you are alone. If you can't put on a mask (because of trouble breathing, for example), cover your coughs and sneezes in some other way. Try to stay at least 6 feet away from other people. This will help protect the people around you. Masks should not be placed on young children under age 55 years, anyone who has trouble breathing, or  anyone who is not able to remove the mask without help. Cover your coughs and sneezes Cover your mouth and nose with a tissue when you cough or sneeze. Throw away used tissues in a lined trash can. Immediately wash your hands with soap and water for at least 20 seconds. If soap and water are not available, clean your hands with an alcohol-based hand sanitizer that contains at least 60% alcohol. Clean your hands often Wash your hands often with soap and water for at least 20 seconds. This is especially important after blowing your nose, coughing, or sneezing; going to the bathroom; and before eating or preparing food. Use hand sanitizer if soap and water are not available. Use an alcohol-based hand sanitizer with at least  60% alcohol, covering all surfaces of your hands and rubbing them together until they feel dry. Soap and water are the best option, especially if hands are visibly dirty. Avoid touching your eyes, nose, and mouth with unwashed hands. Handwashing Tips Avoid sharing personal household items Do not share dishes, drinking glasses, cups, eating utensils, towels, or bedding with other people in your home. Wash these items thoroughly after using them with soap and water or put in the dishwasher. Clean surfaces in your home regularly Clean and disinfect high-touch surfaces (for example, doorknobs, tables, handles, light switches, and countertops) in your "sick room" and bathroom. In shared spaces, you should clean and disinfect surfaces and items after each use by the person who is ill. If you are sick and cannot clean, a caregiver or other person should only clean and disinfect the area around you (such as your bedroom and bathroom) on an as needed basis. Your caregiver/other person should wait as long as possible (at least several hours) and wear a mask before entering, cleaning, and disinfecting shared spaces that you use. Clean and disinfect areas that may have blood, stool, or body fluids on them. Use household cleaners and disinfectants. Clean visible dirty surfaces with household cleaners containing soap or detergent. Then, use a household disinfectant. Use a product from [employer] List N: Disinfectants for Coronavirus (COVID-19). Be sure to follow the instructions on the label to ensure safe and effective use of the product. Many products recommend keeping the surface wet with a disinfectant for a certain period of time (look at "contact time" on the product label). You may also need to wear personal protective equipment, such as gloves, depending on the directions on the product label. Immediately after disinfecting, wash your hands with soap and water for 20 seconds. For completed guidance on cleaning  and disinfecting your home, visit Complete Disinfection Guidance. Take steps to improve ventilation at home Improve ventilation (air flow) at home to help prevent from spreading COVID-19 to other people in your household. Clear out COVID-19 virus particles in the air by opening windows, using air filters, and turning on fans in your home. Use this interactive tool to learn how to improve air flow in your home. When you can be around others after being sick with COVID-19 Deciding when you can be around others is different for different situations. Find out when you can safely end home isolation. For any additional questions about your care, contact your healthcare provider or state or local health department. 06/28/2020 Content source: [facility] ([facility]), Division of Viral Diseases This information is not intended to replace advice given to you by your health care provider. Make sure you discuss any questions you have with your health  care provider. Document Revised: 08/11/2020 Document Reviewed: 08/11/2020 Elsevier Patient Education  2022 [employer].      If you have been instructed to have an in-person evaluation today at a local Urgent Care facility, please use the link below. It will take you to a list of all of our available [facility], including address, phone number and hours of operation. Please do not delay care.  [facility]  If you or a family member do not have a primary care provider, use the link below to schedule a visit and establish care. When you choose a [facility] primary care physician or advanced practice provider, you gain a long-term partner in health. Find a Primary Care Provider  Learn more about [facility]'s in-office and virtual care options: [facility]

## 2022-01-02 NOTE — Progress Notes (Signed)
Virtual Visit Consent   Brandy Beasley, you are scheduled for a virtual visit with a [facility] provider today. Just as with appointments in the office, your consent must be obtained to participate. Your consent will be active for this visit and any virtual visit you may have with one of our providers in the next 365 days. If you have a MyChart account, a copy of this consent can be sent to you electronically.  As this is a virtual visit, video technology does not allow for your provider to perform a traditional examination. This may limit your provider's ability to fully assess your condition. If your provider identifies any concerns that need to be evaluated in person or the need to arrange testing (such as labs, EKG, etc.), we will make arrangements to do so. Although advances in technology are sophisticated, we cannot ensure that it will always work on either your end or our end. If the connection with a video visit is poor, the visit may have to be switched to a telephone visit. With either a video or telephone visit, we are not always able to ensure that we have a secure connection.  By engaging in this virtual visit, you consent to the provision of healthcare and authorize for your insurance to be billed (if applicable) for the services provided during this visit. Depending on your insurance coverage, you may receive a charge related to this service.  I need to obtain your verbal consent now. Are you willing to proceed with your visit today? Brandy Beasley has provided verbal consent on 01/02/2022 for a virtual visit (video or telephone). Leeanne Rio, [place]  Date: 01/02/2022 1:07 PM  Virtual Visit via Video Note   I, Leeanne Rio, connected with  Brandy Beasley  ([id number], 1980-10-25) on 01/02/22 at  1:00 PM EDT by a video-enabled telemedicine application and verified that I am speaking with the correct person using two identifiers.  Location: Patient: Virtual Visit Location  Patient: Home Provider: Virtual Visit Location Provider: Home Office   I discussed the limitations of evaluation and management by telemedicine and the availability of in person appointments. The patient expressed understanding and agreed to proceed.    History of Present Illness: Brandy Beasley is a 41 y.o. who identifies as a female who was assigned female at birth, and is being seen today for substantial symptoms starting Sunday with headache, sneezing, cough, congestion, body aches and chills. Took home COVID test which was positive but noted it had just expired. At such [place] made her come in for testing in person which was negative. Notes she is still having substantial symptoms. Some chest tightness without chest pain. Taking OTC Tylenol severe cold and some mucinex.   HPI: HPI  Problems:  Patient Active Problem List   Diagnosis Date Noted   Cellulitis of left leg without foot 01/11/2017   Normocytic anemia 01/10/2017   Indication for care in labor or delivery 10/20/2013   Vaginal delivery 10/20/2013   Vomiting and diarrhea 08/27/2013   Headache 08/27/2013   Supervision of other normal pregnancy 07/05/2013   Tobacco use complicating pregnancy 01/23/5101   Candidiasis of vulva and vagina 05/07/2013   BV (bacterial vaginosis) 02/12/2013   GERD without esophagitis 02/12/2013    Allergies: No Known Allergies Medications:  Current Outpatient Medications:    albuterol (VENTOLIN HFA) 108 (90 Base) MCG/ACT inhaler, Inhale 2 puffs into the lungs every 6 (six) hours as needed for wheezing or shortness of breath.,  Disp: 8 g, Rfl: 0   benzonatate (TESSALON) 100 MG capsule, Take 1 capsule (100 mg total) by mouth 3 (three) times daily as needed for cough., Disp: 30 capsule, Rfl: 0   COVID-19 At Home Antigen Test KIT, Use as directed., Disp: 1 kit, Rfl: 0  Observations/Objective: Patient is well-developed, well-nourished in no acute distress.  Resting comfortably at home.  Head is  normocephalic, atraumatic.  No labored breathing. Speech is clear and coherent with logical content.  Patient is alert and oriented at baseline.   Assessment and Plan: 1. Suspected COVID-19 virus infection - benzonatate (TESSALON) 100 MG capsule; Take 1 capsule (100 mg total) by mouth 3 (three) times daily as needed for cough.  Dispense: 30 capsule; Refill: 0 - albuterol (VENTOLIN HFA) 108 (90 Base) MCG/ACT inhaler; Inhale 2 puffs into the lungs every 6 (six) hours as needed for wheezing or shortness of breath.  Dispense: 8 g; Refill: 0 - COVID-19 At Home Antigen Test KIT; Use as directed.  Dispense: 1 kit; Refill: 0  Want her to retest as she has classic symptoms and positive at-home test. Even though this was expired giving her constellation of symptoms, I feel it was more accurate. Will have her repeat testing at home due to difficulties with [facility]. Script sent to pharmacy to make cheaper. She is to message with results as soon as available. Supportive measures, OTC medications and vitamin recommendations reviewed with patient. Will add additional treatments and set quarantine based on repeat results.   Follow Up Instructions: I discussed the assessment and treatment plan with the patient. The patient was provided an opportunity to ask questions and all were answered. The patient agreed with the plan and demonstrated an understanding of the instructions.  A copy of instructions were sent to the patient via MyChart unless otherwise noted below.   The patient was advised to call back or seek an in-person evaluation if the symptoms worsen or if the condition fails to improve as anticipated.  Time:  I spent 10 minutes with the patient via telehealth technology discussing the above problems/concerns.    Leeanne Rio, PA-C

## 2022-01-03 MED ORDER — MOLNUPIRAVIR EUA 200MG CAPSULE: 4.0000 | ORAL_CAPSULE | Freq: Two times a day (BID) | ORAL | 0 refills | Status: AC

## 2022-03-14 ENCOUNTER — Ambulatory Visit: Payer: 59 | Admitting: Family Medicine

## 2022-04-10 ENCOUNTER — Ambulatory Visit: Payer: Medicaid Other | Admitting: Family Medicine

## 2022-04-10 VITALS — BP 130/84 | HR 90 | Temp 99.0°F | Ht 65.0 in | Wt 254.0 lb

## 2022-04-10 DIAGNOSIS — Z1231 Encounter for screening mammogram for malignant neoplasm of breast: Secondary | ICD-10-CM

## 2022-04-10 DIAGNOSIS — Z0001 Encounter for general adult medical examination with abnormal findings: Secondary | ICD-10-CM

## 2022-04-10 DIAGNOSIS — F17200 Nicotine dependence, unspecified, uncomplicated: Secondary | ICD-10-CM

## 2022-04-10 DIAGNOSIS — Z833 Family history of diabetes mellitus: Secondary | ICD-10-CM | POA: Diagnosis not present

## 2022-04-10 DIAGNOSIS — Z122 Encounter for screening for malignant neoplasm of respiratory organs: Secondary | ICD-10-CM

## 2022-04-10 DIAGNOSIS — Z6841 Body Mass Index (BMI) 40.0 and over, adult: Secondary | ICD-10-CM | POA: Diagnosis not present

## 2022-04-10 DIAGNOSIS — R5383 Other fatigue: Secondary | ICD-10-CM | POA: Insufficient documentation

## 2022-04-10 DIAGNOSIS — E66813 Obesity, class 3: Secondary | ICD-10-CM | POA: Insufficient documentation

## 2022-04-10 DIAGNOSIS — Z0000 Encounter for general adult medical examination without abnormal findings: Secondary | ICD-10-CM | POA: Insufficient documentation

## 2022-04-10 MED ORDER — BUPROPION HCL ER (SR) 150 MG PO TB12: 150.0000 mg | ORAL_TABLET | Freq: Two times a day (BID) | ORAL | 3 refills | Status: DC

## 2022-04-10 NOTE — Assessment & Plan Note (Signed)
Patient is interested in quitting smoking. She would like to try Wellbutrin with a quit date of 04/26/2021. I am very proud of her for being motivated to quit. Instructed to start Wellbutrin SR 150mg  daily for 3 days then increase to BID. Will follow up with her in 1 month to assess status.

## 2022-04-10 NOTE — Assessment & Plan Note (Signed)
Will obtain fasting labs this week. Referral placed for sleep study.

## 2022-04-10 NOTE — Patient Instructions (Signed)
It was great to meet you today and I'm excited to have you join the [facility] practice. I hope you had a positive experience today! If you feel so inclined, please feel free to recommend our practice to friends and family. Oasis Goehring, FNP-C  

## 2022-04-10 NOTE — Progress Notes (Signed)
New Patient Office Visit  Subjective    Patient ID: Brandy Beasley, female    DOB: 1980-04-12  Age: 42 y.o. MRN: [id number]  CC:  Chief Complaint  Patient presents with   Establish Care    New patient; establish care (hasn't had a PCP. Pt was only going to OB/GYN for care; hasn't seen them in over 3 years.). Pt will request med records from OB/GYN provider and pick up new pt packet 11/17 - [facility]     HPI Brandy Beasley presents to establish care. Oriented to practice routines and expectations. Has not had health insurance and no doctors visits in over 9 years. Her main concerns include her weight, family hx of DM, and sleeplessness. She denies chronic illnesses, takes no medications, but she did have gestational diabetes with all 3 of her pregnancies. She reports being increasingly fatigued lately and endorses snoring, headaches, smoking, and waking up startled.  Cervical cancer screening - 2015 normal Mammogram - never Tobacco smoker - 1.5ppd 22 years ready to quit 04/26/21    Outpatient Encounter Medications as of 04/10/2022  Medication Sig   buPROPion (WELLBUTRIN SR) 150 MG 12 hr tablet Take 1 tablet (150 mg total) by mouth 2 (two) times daily. Take 1 tablet daily for first 3 days then increase to 1 tablet twice daily.   COVID-19 At Home Antigen Test KIT Use as directed.   [DISCONTINUED] albuterol (VENTOLIN HFA) 108 (90 Base) MCG/ACT inhaler Inhale 2 puffs into the lungs every 6 (six) hours as needed for wheezing or shortness of breath.   [DISCONTINUED] benzonatate (TESSALON) 100 MG capsule Take 1 capsule (100 mg total) by mouth 3 (three) times daily as needed for cough.   No facility-administered encounter medications on file as of 04/10/2022.    Past Medical History:  Diagnosis Date   Cellulitis    left lower extremity   GERD (gastroesophageal reflux disease)    Headache    Vaginal delivery 2002, 2006, 2015    Past Surgical History:  Procedure Laterality Date    CHOLECYSTECTOMY     LAPAROSCOPIC BILATERAL SALPINGECTOMY Bilateral 12/25/2013   Procedure: LAPAROSCOPIC BILATERAL SALPINGECTOMY;  Surgeon: Lahoma Crocker, MD;  Location: [facility];  Service: Gynecology;  Laterality: Bilateral;   TUBAL LIGATION  10/2013    Family History  Problem Relation Age of Onset   Hypertension Mother    HIV Father    Diabetes Maternal Grandmother    Hypertension Maternal Grandmother    Cancer Maternal Grandfather    Hypertension Other    Diabetes Other    Cancer Other     Social History   Socioeconomic History   Marital status: Single    Spouse name: Not on file   Number of children: Not on file   Years of education: Not on file   Highest education level: Not on file  Occupational History   Not on file  Tobacco Use   Smoking status: Some Days    Packs/day: 0.25    Years: 17.00    Total pack years: 4.25    Types: Cigarettes   Smokeless tobacco: Never   Tobacco comments:    states she is cutting back  Vaping Use   Vaping Use: Never used  Substance and Sexual Activity   Alcohol use: No   Drug use: No    Types: Marijuana    Comment: occasional last use 10-22-13   Sexual activity: Not Currently  Other Topics Concern   Not on file  Social  History Narrative   Not on file   Social Determinants of Health   Financial Resource Strain: Not on file  Food Insecurity: Not on file  Transportation Needs: Not on file  Physical Activity: Not on file  Stress: Not on file  Social Connections: Not on file  Intimate Partner Violence: Not on file    Review of Systems  All other systems reviewed and are negative.       Objective    BP 130/84   Pulse 90   Temp 99 F (37.2 C) (Oral)   Ht _0  (1.651 m)   Wt 254 lb (115.2 kg)   LMP 03/25/2022   SpO2 98%   Breastfeeding No   BMI 42.27 kg/m   Physical Exam Vitals and nursing note reviewed.  Constitutional:      Appearance: Normal appearance. She is obese.  HENT:     Head: Normocephalic  and atraumatic.     Right Ear: Tympanic membrane, ear canal and external ear normal.     Left Ear: Tympanic membrane, ear canal and external ear normal.     Nose: Nose normal.     Mouth/Throat:     Mouth: Mucous membranes are moist.     Pharynx: Oropharynx is clear.  Eyes:     Extraocular Movements: Extraocular movements intact.     Conjunctiva/sclera: Conjunctivae normal.     Pupils: Pupils are equal, round, and reactive to light.  Cardiovascular:     Rate and Rhythm: Normal rate and regular rhythm.     Pulses: Normal pulses.     Heart sounds: Normal heart sounds.  Pulmonary:     Effort: Pulmonary effort is normal.     Breath sounds: Normal breath sounds.  Abdominal:     General: Bowel sounds are normal.     Palpations: Abdomen is soft.  Musculoskeletal:        General: Normal range of motion.     Cervical back: Normal range of motion and neck supple.  Skin:    General: Skin is warm and dry.     Capillary Refill: Capillary refill takes less than 2 seconds.  Neurological:     General: No focal deficit present.     Mental Status: She is alert and oriented to person, place, and time. Mental status is at baseline.  Psychiatric:        Mood and Affect: Mood normal.        Behavior: Behavior normal.        Thought Content: Thought content normal.        Judgment: Judgment normal.         Assessment & Plan:   Problem List Items Addressed This Visit       Other   Physical exam, annual - Primary    Today your medical history was reviewed and routine physical exam with labs was performed. Recommend 150 minutes of moderate intensity exercise weekly and consuming a well-balanced diet. Advised to stop smoking if a smoker, avoid smoking if a non-smoker, limit alcohol consumption to 1 drink per day for women and 2 drinks per day for men, and avoid illicit drug use. Counseled on safe sex practices and offered STI testing today. Counseled on the importance of sunscreen use. Counseled  in mental health awareness and when to seek medical care. Vaccine maintenance discussed. Appropriate health maintenance items reviewed. Return to office in 1 year for annual physical exam.       Relevant Orders   CBC  with Differential/Platelet   COMPLETE METABOLIC PANEL WITH GFR   Hemoglobin A1c   Lipid panel   TSH   Tobacco use disorder    Patient is interested in quitting smoking. She would like to try Wellbutrin with a quit date of 04/26/2021. I am very proud of her for being motivated to quit. Instructed to start Wellbutrin SR 172m daily for 3 days then increase to BID. Will follow up with her in 1 month to assess status.      Class 3 severe obesity due to excess calories with body mass index (BMI) of 40.0 to 44.9 in adult ([facility]    Discussed importance of cutting out sugary drinks, she reports consuming up to 12 sodas per day. We spoke in length about healthy dietary choices as well as the importance of exercise in overall health.      Relevant Orders   Amb Ref to Medical Weight Management   Fatigue    Will obtain fasting labs this week. Referral placed for sleep study.      Relevant Orders   Ambulatory referral to Sleep Studies   Other Visit Diagnoses     Encounter for screening mammogram for malignant neoplasm of breast       Relevant Orders   MM DIGITAL SCREENING BILATERAL   Family history of diabetes mellitus       Relevant Orders   Hemoglobin A1c   Screening for lung cancer       Relevant Orders   CT CHEST LUNG CANCER SCREENING LOW DOSE WO CONTRAST       Return in about 2 days (around 04/12/2022) for pap smear, fasting labs.   ARubie Maid FNP

## 2022-04-10 NOTE — Assessment & Plan Note (Signed)
Discussed importance of cutting out sugary drinks, she reports consuming up to 12 sodas per day. We spoke in length about healthy dietary choices as well as the importance of exercise in overall health.

## 2022-04-10 NOTE — Assessment & Plan Note (Signed)

## 2022-04-11 NOTE — Addendum Note (Signed)
Addended by: Rubie Maid on: 04/11/2022 08:13 AM   Modules accepted: Level of Service

## 2022-04-17 ENCOUNTER — Encounter: Payer: Self-pay | Admitting: Family Medicine

## 2022-04-17 ENCOUNTER — Ambulatory Visit (INDEPENDENT_AMBULATORY_CARE_PROVIDER_SITE_OTHER): Payer: Medicaid Other | Admitting: Family Medicine

## 2022-04-17 VITALS — BP 138/102 | HR 72 | Temp 98.8°F | Ht 65.0 in | Wt 258.0 lb

## 2022-04-17 DIAGNOSIS — G44201 Tension-type headache, unspecified, intractable: Secondary | ICD-10-CM

## 2022-04-17 DIAGNOSIS — R87610 Atypical squamous cells of undetermined significance on cytologic smear of cervix (ASC-US): Secondary | ICD-10-CM | POA: Diagnosis not present

## 2022-04-17 DIAGNOSIS — Z0000 Encounter for general adult medical examination without abnormal findings: Secondary | ICD-10-CM

## 2022-04-17 DIAGNOSIS — Z113 Encounter for screening for infections with a predominantly sexual mode of transmission: Secondary | ICD-10-CM | POA: Diagnosis not present

## 2022-04-17 DIAGNOSIS — Z124 Encounter for screening for malignant neoplasm of cervix: Secondary | ICD-10-CM | POA: Diagnosis not present

## 2022-04-17 DIAGNOSIS — R87810 Cervical high risk human papillomavirus (HPV) DNA test positive: Secondary | ICD-10-CM | POA: Diagnosis not present

## 2022-04-17 DIAGNOSIS — A5901 Trichomonal vulvovaginitis: Secondary | ICD-10-CM | POA: Diagnosis not present

## 2022-04-17 DIAGNOSIS — Z136 Encounter for screening for cardiovascular disorders: Secondary | ICD-10-CM | POA: Diagnosis not present

## 2022-04-17 DIAGNOSIS — Z833 Family history of diabetes mellitus: Secondary | ICD-10-CM

## 2022-04-17 LAB — WET PREP FOR TRICH, YEAST, CLUE

## 2022-04-17 NOTE — Progress Notes (Addendum)
Subjective:   Brandy Beasley is a 42 y.o. female for annual routine Pap and checkup. Current Outpatient Medications  Medication Sig Dispense Refill   buPROPion (WELLBUTRIN SR) 150 MG 12 hr tablet Take 1 tablet (150 mg total) by mouth 2 (two) times daily. Take 1 tablet daily for first 3 days then increase to 1 tablet twice daily. 90 tablet 3   COVID-19 At Home Antigen Test KIT Use as directed. 1 kit 0   No current facility-administered medications for this visit.   Allergies: Patient has no known allergies.  Patient's last menstrual period was 04/13/2022 (exact date).  ROS:  Feeling well. No dyspnea or chest pain on exertion.  No abdominal pain, change in bowel habits, black or bloody stools.  No urinary tract symptoms. GYN ROS: no breast pain or new or enlarging lumps on self exam, she complains of heavy bleeding with clots and cramping. She does complain of headaches every day or every other day for last 2 months. The pain is 10/10, in the back of her head to the front, worse when lying, described as throbbing, unrelieved by Ibuprofen 600mg  daily. Denies fever, nausea, vomiting, vision changes.  Objective:   The patient appears well, alert, oriented x 3, in no distress. BP (!) 138/102   Pulse 72   Temp 98.8 F (37.1 C) (Oral)   Ht 5\' 5"  (1.651 m)   Wt 258 lb (117 kg)   LMP 04/13/2022 (Exact Date)   SpO2 98%   BMI 42.93 kg/m  ENT normal.  Neck supple with tenderness on palpation to posterior neck at shoulder level. No adenopathy or thyromegaly. PERLA. Lungs are clear, good air entry, no wheezes, rhonchi or rales. S1 and S2 normal, no murmurs, regular rate and rhythm. Abdomen soft without tenderness, guarding, mass or organomegaly. Extremities show no edema, normal peripheral pulses. Neurological is normal, no focal findings.  BREAST EXAM: breasts appear normal, no suspicious masses, no skin or nipple changes or axillary nodes  PELVIC EXAM: normal external genitalia, vulva,  vagina, cervix, uterus and adnexa, PAP: Pap smear done today, thin-prep method, WET MOUNT done - results: clue cells, excessive bacteria  Assessment & Plan:   PLAN:  Mammogram next month pap smear additional lab tests per orders return annually or prn    Cervical cancer screening -     Pap, TP Imaging w/ CT/GC and w/ HPV RNA, rflx HPV Type 16/18 -     WET PREP FOR TRICH, YEAST, CLUE -     C. trachomatis/N. gonorrhoeae RNA  Routine screening for STI (sexually transmitted infection) -     WET PREP FOR [place], YEAST, CLUE -     C. trachomatis/N. gonorrhoeae RNA  Physical exam, annual Assessment & Plan: Fasting labs today for physical exam performed 04/10/22  Orders: -     TSH -     Lipid panel -     Hemoglobin A1c -     COMPLETE METABOLIC PANEL WITH GFR -     CBC with Differential/Platelet  Family history of diabetes mellitus -     Hemoglobin A1c  Acute intractable tension-type headache Assessment & Plan: Patients symptoms are consistent with tension-type headaches, negative SNNOOP10. Instructed to try Ibuprofen 800mg  every 6 hours as needed for next 3 days and if headaches persist follow-up in office. Encouraged to seek medical care for facial drooping, difficulty speaking, confusion, vision changes, weakness or numbness, difficulty walking, or acute worsening of headache.      Follow up  plan: Return in about 1 year (around 04/18/2023) for well woman exam.  Park Meo, FNP

## 2022-04-17 NOTE — Assessment & Plan Note (Signed)
Fasting labs today for physical exam performed 04/10/22

## 2022-04-17 NOTE — Assessment & Plan Note (Signed)
Patients symptoms are consistent with tension-type headaches, negative SNNOOP10. Instructed to try Ibuprofen 800mg  every 6 hours as needed for next 3 days and if headaches persist follow-up in office. Encouraged to seek medical care for facial drooping, difficulty speaking, confusion, vision changes, weakness or numbness, difficulty walking, or acute worsening of headache.

## 2022-04-18 LAB — COMPLETE METABOLIC PANEL WITH GFR
AG Ratio: 1.3 (calc) (ref 1.0–2.5)
ALT: 10 U/L (ref 6–29)
AST: 10 U/L (ref 10–30)
Albumin: 3.9 g/dL (ref 3.6–5.1)
Alkaline phosphatase (APISO): 76 U/L (ref 31–125)
BUN: 10 mg/dL (ref 7–25)
CO2: 26 mmol/L (ref 20–32)
Calcium: 9.3 mg/dL (ref 8.6–10.2)
Chloride: 104 mmol/L (ref 98–110)
Creat: 0.63 mg/dL (ref 0.50–0.99)
Globulin: 3 g/dL (calc) (ref 1.9–3.7)
Glucose, Bld: 112 mg/dL — ABNORMAL HIGH (ref 65–99)
Potassium: 4.4 mmol/L (ref 3.5–5.3)
Sodium: 140 mmol/L (ref 135–146)
Total Bilirubin: 0.2 mg/dL (ref 0.2–1.2)
Total Protein: 6.9 g/dL (ref 6.1–8.1)
eGFR: 114 mL/min/{1.73_m2} (ref >=60)

## 2022-04-18 LAB — HEMOGLOBIN A1C
Hgb A1c MFr Bld: 7 % of total Hgb — ABNORMAL HIGH (ref <5.7)
Mean Plasma Glucose: 154 mg/dL
eAG (mmol/L): 8.5 mmol/L

## 2022-04-18 LAB — CBC WITH DIFFERENTIAL/PLATELET
Absolute Monocytes: 509 cells/uL (ref 200–950)
Basophils Absolute: 29 cells/uL (ref 0–200)
Basophils Relative: 0.3 %
Eosinophils Absolute: 403 cells/uL (ref 15–500)
Eosinophils Relative: 4.2 %
HCT: 36.3 % (ref 35.0–45.0)
Hemoglobin: 12.1 g/dL (ref 11.7–15.5)
Lymphs Abs: 3648 cells/uL (ref 850–3900)
MCH: 28.6 pg (ref 27.0–33.0)
MCHC: 33.3 g/dL (ref 32.0–36.0)
MCV: 85.8 fL (ref 80.0–100.0)
MPV: 11.9 fL (ref 7.5–12.5)
Monocytes Relative: 5.3 %
Neutro Abs: 5011 cells/uL (ref 1500–7800)
Neutrophils Relative %: 52.2 %
Platelets: 186 10*3/uL (ref 140–400)
RBC: 4.23 10*6/uL (ref 3.80–5.10)
RDW: 13.5 % (ref 11.0–15.0)
Total Lymphocyte: 38 %
WBC: 9.6 10*3/uL (ref 3.8–10.8)

## 2022-04-18 LAB — LIPID PANEL
Cholesterol: 206 mg/dL — ABNORMAL HIGH (ref <200)
HDL: 38 mg/dL — ABNORMAL LOW (ref >=50)
LDL Cholesterol (Calc): 136 mg/dL (calc) — ABNORMAL HIGH
Non-HDL Cholesterol (Calc): 168 mg/dL (calc) — ABNORMAL HIGH (ref <130)
Total CHOL/HDL Ratio: 5.4 (calc) — ABNORMAL HIGH (ref <5.0)
Triglycerides: 183 mg/dL — ABNORMAL HIGH (ref <150)

## 2022-04-18 LAB — C. TRACHOMATIS/N. GONORRHOEAE RNA
C. trachomatis RNA, TMA: NOT DETECTED
N. gonorrhoeae RNA, TMA: NOT DETECTED

## 2022-04-18 LAB — TSH: TSH: 2.32 mIU/L

## 2022-04-19 ENCOUNTER — Other Ambulatory Visit: Payer: Self-pay | Admitting: Family Medicine

## 2022-04-19 DIAGNOSIS — E119 Type 2 diabetes mellitus without complications: Secondary | ICD-10-CM

## 2022-04-19 DIAGNOSIS — R87610 Atypical squamous cells of undetermined significance on cytologic smear of cervix (ASC-US): Secondary | ICD-10-CM

## 2022-04-19 LAB — C. TRACHOMATIS/N. GONORRHOEAE RNA
C. trachomatis RNA, TMA: NOT DETECTED
N. gonorrhoeae RNA, TMA: NOT DETECTED

## 2022-04-19 LAB — PAP, TP IMAGING W/ HPV RNA, RFLX HPV TYPE 16,18/45: HPV DNA High Risk: DETECTED — AB

## 2022-04-19 LAB — PAP, TP IMAGING W/ CT/GC AND W/ HPV RNA, RFLX HPV TYPE 16/18

## 2022-04-19 MED ORDER — METFORMIN HCL 500 MG PO TABS: 500.0000 mg | ORAL_TABLET | Freq: Two times a day (BID) | ORAL | 3 refills | Status: DC

## 2022-04-19 NOTE — Addendum Note (Signed)
Addended by: Rubie Maid on: 04/19/2022 04:50 PM   Modules accepted: Level of Service

## 2022-04-24 ENCOUNTER — Ambulatory Visit: Payer: Commercial Managed Care - PPO | Admitting: Family Medicine

## 2022-04-25 ENCOUNTER — Encounter: Payer: Self-pay | Admitting: Family Medicine

## 2022-04-25 ENCOUNTER — Ambulatory Visit: Payer: Medicaid Other | Admitting: Family Medicine

## 2022-04-25 VITALS — BP 142/88 | HR 80 | Temp 97.7°F | Ht 65.0 in | Wt 256.0 lb

## 2022-04-25 DIAGNOSIS — R87610 Atypical squamous cells of undetermined significance on cytologic smear of cervix (ASC-US): Secondary | ICD-10-CM

## 2022-04-25 DIAGNOSIS — Z6841 Body Mass Index (BMI) 40.0 and over, adult: Secondary | ICD-10-CM

## 2022-04-25 DIAGNOSIS — R030 Elevated blood-pressure reading, without diagnosis of hypertension: Secondary | ICD-10-CM | POA: Diagnosis not present

## 2022-04-25 DIAGNOSIS — R87810 Cervical high risk human papillomavirus (HPV) DNA test positive: Secondary | ICD-10-CM

## 2022-04-25 DIAGNOSIS — R7309 Other abnormal glucose: Secondary | ICD-10-CM | POA: Diagnosis not present

## 2022-04-25 DIAGNOSIS — E782 Mixed hyperlipidemia: Secondary | ICD-10-CM | POA: Insufficient documentation

## 2022-04-25 NOTE — Assessment & Plan Note (Signed)
Counseled on lifestyle modifications including consuming a heart healthy diet, getting 150 minutes per week of moderate intensity exercise, and tobacco cessation (she has started wellbutrin and has a quit date of tomorrow). Will recheck fasting lipids in 3 months.

## 2022-04-25 NOTE — Progress Notes (Addendum)
Acute Office Visit  Subjective:     Patient ID: Brandy Beasley, female    DOB: 11-19-1980, 42 y.o.   MRN: [id number]  Chief Complaint  Patient presents with   Follow-up    F/u labs/B/P    HPI Patient is in today for follow-up on lab results. She had a physical with labs last week after many years lapse in medical care and results showed A1c 7.0%, mixed hyperlipidemia, and PAP results ASCUS with HPV. Since her previous visit to establish care she has cut back her caffeine intake from many sodas per day to less zero sugar sodas and eliminated frappuccinos. She reports some caffeine withdrawal headaches, but headaches are overall improving. She has also started on Wellbutrin BID to aid with tobacco cessation, her target quit date is tomorrow and she has already been able to cut back. She has not yet been able to incorporate exercise, but has a goal to start adding physical activity. She is also working on obtaining a blood pressure cuff from her mother.  Review of Systems  All other systems reviewed and are negative.       Objective:    BP (!) 142/88   Pulse 80   Temp 97.7 F (36.5 C) (Oral)   Ht 5\' 5"  (1.651 m)   Wt 256 lb (116.1 kg)   LMP 04/13/2022 (Exact Date)   SpO2 97%   BMI 42.60 kg/m  BP Readings from Last 3 Encounters:  04/25/22 (!) 142/88  04/17/22 (!) 138/102  04/10/22 130/84   Wt Readings from Last 3 Encounters:  04/25/22 256 lb (116.1 kg)  04/17/22 258 lb (117 kg)  04/10/22 254 lb (115.2 kg)      Physical Exam Vitals and nursing note reviewed.  Constitutional:      Appearance: Normal appearance. She is obese.  HENT:     Head: Normocephalic and atraumatic.  Skin:    General: Skin is warm and dry.  Neurological:     General: No focal deficit present.     Mental Status: She is alert and oriented to person, place, and time. Mental status is at baseline.  Psychiatric:        Mood and Affect: Mood normal.        Behavior: Behavior normal.         Thought Content: Thought content normal.        Judgment: Judgment normal.     No results found for any visits on 04/25/22.      Assessment & Plan:   Problem List Items Addressed This Visit       Other   Mixed hyperlipidemia - Primary    Counseled on lifestyle modifications including consuming a heart healthy diet, getting 150 minutes per week of moderate intensity exercise, and tobacco cessation (she has started wellbutrin and has a quit date of tomorrow). Will recheck fasting lipids in 3 months.      Relevant Orders   Lipid panel   Elevated blood pressure reading in office without diagnosis of hypertension    BP elevated today in office 142/88. Counseled on lifestyle modifications including consuming a heart healthy diet, getting 150 minutes per week of moderate intensity exercise, tobacco cessation (she has started wellbutrin and has a quit date of tomorrow), and limiting caffeine intake (previous very high intake of sodas). Will monitor BP at home and report values sustained >130/80. Will recheck at next visit and may need to initiate antihypertensive if lifestyle modifications insufficient. Seek medical care  for chest pain, palpitations, shortness of breath, lightheadedness, dizziness, swelling in extremities.      Relevant Orders   COMPLETE METABOLIC PANEL WITH GFR   Elevated hemoglobin A1c    Counseled on lifestyle modifications including consuming a carb conscious, heart healthy diet, getting 150 minutes per week of moderate intensity exercise, and tobacco cessation (she has started wellbutrin and has a quit date of tomorrow). She has started Metformin 500mg  BID and will continue this. Recheck A1c in 3 months.      Relevant Orders   Hemoglobin A1c   Vitamin B12   ASCUS with positive high risk HPV cervical    Discussed PAP results with patient today and explained next steps including colposcopy. Referral has been placed for GYN and she has an appointment scheduled for next  month.      Morbid obesity with BMI of 40.0-44.9, adult ([place])    Counseled on lifestyle modifications including consuming a heart healthy diet, getting 150 minutes per week of moderate intensity exercise, and tobacco cessation (she has started wellbutrin and has a quit date of tomorrow). She meets with medical weight management in March. Will consider Ozempic at next visit if lifestyle modifications insufficient.       No orders of the defined types were placed in this encounter.   Return in about 3 months (around 07/25/2022) for follow up with fasting labs 1 week prior.  Rubie Maid, FNP

## 2022-04-25 NOTE — Assessment & Plan Note (Addendum)
BP elevated today in office 142/88. Counseled on lifestyle modifications including consuming a heart healthy diet, getting 150 minutes per week of moderate intensity exercise, tobacco cessation (she has started wellbutrin and has a quit date of tomorrow), and limiting caffeine intake (previous very high intake of sodas). Will monitor BP at home and report values sustained >130/80. Will recheck at next visit and may need to initiate antihypertensive if lifestyle modifications insufficient. Seek medical care for chest pain, palpitations, shortness of breath, lightheadedness, dizziness, swelling in extremities.

## 2022-04-25 NOTE — Assessment & Plan Note (Signed)
Counseled on lifestyle modifications including consuming a heart healthy diet, getting 150 minutes per week of moderate intensity exercise, and tobacco cessation (she has started wellbutrin and has a quit date of tomorrow). She meets with medical weight management in March. Will consider Ozempic at next visit if lifestyle modifications insufficient.

## 2022-04-25 NOTE — Assessment & Plan Note (Signed)
Counseled on lifestyle modifications including consuming a carb conscious, heart healthy diet, getting 150 minutes per week of moderate intensity exercise, and tobacco cessation (she has started wellbutrin and has a quit date of tomorrow). She has started Metformin 500mg  BID and will continue this. Recheck A1c in 3 months.

## 2022-04-25 NOTE — Assessment & Plan Note (Signed)
Discussed PAP results with patient today and explained next steps including colposcopy. Referral has been placed for GYN and she has an appointment scheduled for next month.

## 2022-05-17 ENCOUNTER — Encounter: Payer: Self-pay | Admitting: Neurology

## 2022-05-17 ENCOUNTER — Institutional Professional Consult (permissible substitution): Payer: Commercial Managed Care - PPO | Admitting: Neurology

## 2022-05-30 ENCOUNTER — Ambulatory Visit: Payer: Commercial Managed Care - PPO

## 2022-06-05 ENCOUNTER — Ambulatory Visit: Payer: Commercial Managed Care - PPO | Admitting: Obstetrics & Gynecology

## 2022-06-21 ENCOUNTER — Encounter (INDEPENDENT_AMBULATORY_CARE_PROVIDER_SITE_OTHER): Payer: Medicaid Other | Admitting: Family Medicine

## 2022-07-17 ENCOUNTER — Ambulatory Visit: Payer: Commercial Managed Care - PPO

## 2022-07-18 ENCOUNTER — Ambulatory Visit: Payer: Medicaid Other | Admitting: Obstetrics and Gynecology

## 2022-07-25 ENCOUNTER — Ambulatory Visit: Payer: Commercial Managed Care - PPO | Admitting: Family Medicine

## 2023-02-15 ENCOUNTER — Encounter (HOSPITAL_COMMUNITY): Payer: Self-pay | Admitting: Emergency Medicine

## 2023-02-15 ENCOUNTER — Other Ambulatory Visit: Payer: Self-pay

## 2023-02-15 ENCOUNTER — Emergency Department (HOSPITAL_COMMUNITY)
Admission: EM | Admit: 2023-02-15 | Discharge: 2023-02-15 | Disposition: A | Discharge status: Home / Self Care | Payer: Medicaid Other | Attending: Emergency Medicine | Admitting: Emergency Medicine

## 2023-02-15 DIAGNOSIS — B9689 Other specified bacterial agents as the cause of diseases classified elsewhere: Secondary | ICD-10-CM | POA: Diagnosis not present

## 2023-02-15 DIAGNOSIS — N760 Acute vaginitis: Secondary | ICD-10-CM | POA: Insufficient documentation

## 2023-02-15 DIAGNOSIS — D72829 Elevated white blood cell count, unspecified: Secondary | ICD-10-CM | POA: Diagnosis not present

## 2023-02-15 DIAGNOSIS — R1030 Lower abdominal pain, unspecified: Secondary | ICD-10-CM | POA: Diagnosis present

## 2023-02-15 LAB — COMPREHENSIVE METABOLIC PANEL
ALT: 14 U/L (ref 0–44)
AST: 14 U/L — ABNORMAL LOW (ref 15–41)
Albumin: 3.6 g/dL (ref 3.5–5.0)
Alkaline Phosphatase: 64 U/L (ref 38–126)
Anion gap: 5 (ref 5–15)
BUN: 13 mg/dL (ref 6–20)
CO2: 25 mmol/L (ref 22–32)
Calcium: 8.7 mg/dL — ABNORMAL LOW (ref 8.9–10.3)
Chloride: 106 mmol/L (ref 98–111)
Creatinine, Ser: 0.63 mg/dL (ref 0.44–1.00)
GFR, Estimated: >60 mL/min (ref >60)
Glucose, Bld: 106 mg/dL — ABNORMAL HIGH (ref 70–99)
Potassium: 3.7 mmol/L (ref 3.5–5.1)
Sodium: 136 mmol/L (ref 135–145)
Total Bilirubin: 0.3 mg/dL (ref <1.2)
Total Protein: 7.6 g/dL (ref 6.5–8.1)

## 2023-02-15 LAB — RPR: RPR Ser Ql: NONREACTIVE

## 2023-02-15 LAB — URINALYSIS, ROUTINE W REFLEX MICROSCOPIC
Bacteria, UA: NONE SEEN
Bilirubin Urine: NEGATIVE
Glucose, UA: NEGATIVE mg/dL
Hgb urine dipstick: NEGATIVE
Ketones, ur: NEGATIVE mg/dL
Nitrite: NEGATIVE
Protein, ur: NEGATIVE mg/dL
Specific Gravity, Urine: 1.026 (ref 1.005–1.030)
pH: 5 (ref 5.0–8.0)

## 2023-02-15 LAB — CBC WITH DIFFERENTIAL/PLATELET
Abs Immature Granulocytes: 0.03 10*3/uL (ref 0.00–0.07)
Basophils Absolute: 0 10*3/uL (ref 0.0–0.1)
Basophils Relative: 0 %
Eosinophils Absolute: 0.4 10*3/uL (ref 0.0–0.5)
Eosinophils Relative: 4 %
HCT: 39.3 % (ref 36.0–46.0)
Hemoglobin: 12.3 g/dL (ref 12.0–15.0)
Immature Granulocytes: 0 %
Lymphocytes Relative: 33 %
Lymphs Abs: 3.6 10*3/uL (ref 0.7–4.0)
MCH: 28 pg (ref 26.0–34.0)
MCHC: 31.3 g/dL (ref 30.0–36.0)
MCV: 89.5 fL (ref 80.0–100.0)
Monocytes Absolute: 0.6 10*3/uL (ref 0.1–1.0)
Monocytes Relative: 6 %
Neutro Abs: 6.2 10*3/uL (ref 1.7–7.7)
Neutrophils Relative %: 57 %
Platelets: 173 10*3/uL (ref 150–400)
RBC: 4.39 MIL/uL (ref 3.87–5.11)
RDW: 14 % (ref 11.5–15.5)
WBC: 10.9 10*3/uL — ABNORMAL HIGH (ref 4.0–10.5)
nRBC: 0 % (ref 0.0–0.2)

## 2023-02-15 LAB — WET PREP, GENITAL
Sperm: NONE SEEN
Trich, Wet Prep: NONE SEEN
WBC, Wet Prep HPF POC: <10 (ref <10)
Yeast Wet Prep HPF POC: NONE SEEN

## 2023-02-15 LAB — HIV ANTIBODY (ROUTINE TESTING W REFLEX): HIV Screen 4th Generation wRfx: NONREACTIVE

## 2023-02-15 MED ORDER — DOXYCYCLINE HYCLATE 100 MG PO CAPS: 100.0000 mg | ORAL_CAPSULE | Freq: Two times a day (BID) | ORAL | 0 refills | Status: AC

## 2023-02-15 MED ORDER — CEFTRIAXONE SODIUM 1 G IJ SOLR
500.0000 mg | Freq: Once | INTRAMUSCULAR | Status: AC
  Administered 2023-02-15: 500 mg via INTRAMUSCULAR
  Filled 2023-02-15: qty 10

## 2023-02-15 MED ORDER — METRONIDAZOLE 500 MG PO TABS: 500.0000 mg | ORAL_TABLET | Freq: Two times a day (BID) | ORAL | 0 refills | Status: DC

## 2023-02-15 MED ORDER — STERILE WATER FOR INJECTION IJ SOLN
INTRAMUSCULAR | Status: AC
  Administered 2023-02-15: 10 mL
  Filled 2023-02-15: qty 10

## 2023-02-15 NOTE — ED Triage Notes (Signed)
Presents from home for abd pain and cramping x 3 days, bilateral lower abd; urinary frequency. She is not currently bleeding vaginally but notes that previous cycles have been more heavy than typical.  LMP 10/13 Denies N/V, fever, vaginal discharge

## 2023-02-15 NOTE — Discharge Instructions (Signed)
Thank you for allowing [place] to be a part of your care today.  Your workup was positive for bacterial vaginosis.  I have sent in a prescription for metronidazole to treat this.  DO NOT consume alcohol while taking this medication as it can have serious adverse effects.   You were also sent home with a prescription for doxycycline as a prophylactic treatment.  If your pending test results are negative, you can discontinue this medication.   Follow up with your PCP or OBGYN if your symptoms persist.    Return to the ED if you develop sudden worsening of your symptoms or if you have new concerns.

## 2023-02-15 NOTE — ED Provider Notes (Signed)
[place] EMERGENCY DEPARTMENT AT [facility] Provider Note   CSN: [id number] Arrival date & time: 02/15/23  7564     History  Chief Complaint  Patient presents with   Abdominal Pain    Brandy Beasley is a 42 y.o. female with past medical history significant for GERD, tobacco dependence presents to the ED complaining of lower abdominal pain and cramping for the past 3 days.  She reports urinary frequency also.  Patient notes previous menstrual cycles have been heavier than her typical cycles, but she is not currently having vaginal bleeding, but expects her cycle to start soon.  LMP 01/20/23.  Denies fever, nausea, vomiting, diarrhea, vaginal discharge, dysuria, vaginal pain.  She is concerned for STD/STI after finding out her partner has had intercourse with another person.  Patient would like testing today.          Home Medications Prior to Admission medications   Medication Sig Start Date End Date Taking? Authorizing Provider  buPROPion (WELLBUTRIN SR) 150 MG 12 hr tablet Take 1 tablet (150 mg total) by mouth 2 (two) times daily. Take 1 tablet daily for first 3 days then increase to 1 tablet twice daily. 04/10/22   Park Meo, FNP  COVID-19 At [facility] Antigen Test KIT Use as directed. 01/02/22   Waldon Merl, PA-C  metFORMIN (GLUCOPHAGE) 500 MG tablet Take 1 tablet (500 mg total) by mouth 2 (two) times daily with a meal. 04/19/22   Park Meo, FNP      Allergies    Patient has no known allergies.    Review of Systems   Review of Systems  Constitutional:  Negative for fever.  Gastrointestinal:  Positive for abdominal pain (cramping). Negative for diarrhea, nausea and vomiting.  Genitourinary:  Positive for frequency. Negative for dysuria, pelvic pain, vaginal bleeding, vaginal discharge and vaginal pain.    Physical Exam Updated Vital Signs BP (!) 142/84 (BP Location: Right Arm)   Pulse 85   Temp 98.3 F (36.8 C) (Oral)   Resp 16   Wt 115 kg    LMP 01/20/2023 (Approximate)   SpO2 100%   BMI 42.19 kg/m  Physical Exam Vitals and nursing note reviewed.  Constitutional:      General: She is not in acute distress.    Appearance: Normal appearance. She is not ill-appearing or diaphoretic.  Cardiovascular:     Rate and Rhythm: Normal rate and regular rhythm.  Pulmonary:     Effort: Pulmonary effort is normal.  Abdominal:     General: Abdomen is flat.     Palpations: Abdomen is soft.     Tenderness: There is no abdominal tenderness.  Genitourinary:    Comments: Deferred by patient. Skin:    General: Skin is warm and dry.     Capillary Refill: Capillary refill takes less than 2 seconds.  Neurological:     Mental Status: She is alert. Mental status is at baseline.  Psychiatric:        Mood and Affect: Mood normal.        Behavior: Behavior normal.     ED Results / Procedures / Treatments   Labs (all labs ordered are listed, but only abnormal results are displayed) Labs Reviewed  URINALYSIS, ROUTINE W REFLEX MICROSCOPIC  COMPREHENSIVE METABOLIC PANEL  CBC WITH DIFFERENTIAL/PLATELET    EKG None  Radiology No results found.  Procedures Procedures    Medications Ordered in ED Medications - No data to display  ED Course/  Medical Decision Making/ A&P                                 Medical Decision Making Amount and/or Complexity of Data Reviewed Labs: ordered.   This patient presents to the ED with chief complaint(s) of lower abdominal pain, urinary frequency with non-contributory past medical history.  The complaint involves an extensive differential diagnosis and also carries with it a high risk of complications and morbidity.    The differential diagnosis includes STD/STI, UTI   The initial plan is to obtain labs, UA  Initial Assessment:   Exam significant for soft, non-tender abdomen with palpation.  No distension.  Patient is overall well-appearing.  Pelvic exam deferred by patient.   Discussed with  patient prophylactic treatment of STD/STIs which she would like to do today.   Independent ECG/labs interpretation:  The following labs were independently interpreted:  Wet prep with clue cells, indicative of BV.  No trichomonas or yeast.  CBC with mild leukocytosis, no anemia.  UA without evidence of infection.  Treatment and Reassessment: Patient given prophylactic treatment of gonorrhea with 500 mg IM Rocephin.  Will send home on prescription of doxycycline.  Patient understands that once notified of results, and if they are negative, she can discontinue treatment for chlamydia.   Disposition:   Patient with bacterial vaginosis.  Prescription for Flagyl sent to pharmacy to treat this.  Advised patient she will be notified of pending test results.  Recommended patient follow up with PCP or OBGYN if symptoms persist.  The patient has been appropriately medically screened and/or stabilized in the ED. I have low suspicion for any other emergent medical condition which would require further screening, evaluation or treatment in the ED or require inpatient management. At time of discharge the patient is hemodynamically stable and in no acute distress. I have discussed work-up results and diagnosis with patient and answered all questions. Patient is agreeable with discharge plan. We discussed strict return precautions for returning to the emergency department and they verbalized understanding.           Final Clinical Impression(s) / ED Diagnoses Final diagnoses:  None    Rx / DC Orders ED Discharge Orders     None         Lenard Simmer, PA-C 02/15/23 0905    Anders Simmonds T, DO 02/16/23 1124

## 2023-02-18 LAB — GC/CHLAMYDIA PROBE AMP (~~LOC~~) NOT AT ARMC
Chlamydia: NEGATIVE
Comment: NEGATIVE
Comment: NORMAL
Neisseria Gonorrhea: NEGATIVE

## 2023-06-12 ENCOUNTER — Other Ambulatory Visit: Payer: Self-pay

## 2023-06-12 ENCOUNTER — Emergency Department (HOSPITAL_BASED_OUTPATIENT_CLINIC_OR_DEPARTMENT_OTHER)
Admission: EM | Admit: 2023-06-12 | Discharge: 2023-06-12 | Disposition: A | Discharge status: Home / Self Care | Attending: Emergency Medicine | Admitting: Emergency Medicine

## 2023-06-12 ENCOUNTER — Encounter (HOSPITAL_BASED_OUTPATIENT_CLINIC_OR_DEPARTMENT_OTHER): Payer: Self-pay

## 2023-06-12 DIAGNOSIS — R197 Diarrhea, unspecified: Secondary | ICD-10-CM | POA: Insufficient documentation

## 2023-06-12 DIAGNOSIS — R112 Nausea with vomiting, unspecified: Secondary | ICD-10-CM | POA: Insufficient documentation

## 2023-06-12 LAB — URINALYSIS, ROUTINE W REFLEX MICROSCOPIC
Bacteria, UA: NONE SEEN
Bilirubin Urine: NEGATIVE
Glucose, UA: NEGATIVE mg/dL
Ketones, ur: NEGATIVE mg/dL
Leukocytes,Ua: NEGATIVE
Nitrite: NEGATIVE
Specific Gravity, Urine: 1.034 — ABNORMAL HIGH (ref 1.005–1.030)
pH: 5.5 (ref 5.0–8.0)

## 2023-06-12 LAB — COMPREHENSIVE METABOLIC PANEL
ALT: 15 U/L (ref 0–44)
AST: 14 U/L — ABNORMAL LOW (ref 15–41)
Albumin: 4.4 g/dL (ref 3.5–5.0)
Alkaline Phosphatase: 72 U/L (ref 38–126)
Anion gap: 9 (ref 5–15)
BUN: 13 mg/dL (ref 6–20)
CO2: 22 mmol/L (ref 22–32)
Calcium: 8.8 mg/dL — ABNORMAL LOW (ref 8.9–10.3)
Chloride: 105 mmol/L (ref 98–111)
Creatinine, Ser: 0.68 mg/dL (ref 0.44–1.00)
GFR, Estimated: >60 mL/min (ref >60)
Glucose, Bld: 143 mg/dL — ABNORMAL HIGH (ref 70–99)
Potassium: 4 mmol/L (ref 3.5–5.1)
Sodium: 136 mmol/L (ref 135–145)
Total Bilirubin: 0.5 mg/dL (ref 0.0–1.2)
Total Protein: 7.6 g/dL (ref 6.5–8.1)

## 2023-06-12 LAB — CBC
HCT: 43 % (ref 36.0–46.0)
Hemoglobin: 13.7 g/dL (ref 12.0–15.0)
MCH: 27.4 pg (ref 26.0–34.0)
MCHC: 31.9 g/dL (ref 30.0–36.0)
MCV: 86 fL (ref 80.0–100.0)
Platelets: 197 10*3/uL (ref 150–400)
RBC: 5 MIL/uL (ref 3.87–5.11)
RDW: 14.2 % (ref 11.5–15.5)
WBC: 14.9 10*3/uL — ABNORMAL HIGH (ref 4.0–10.5)
nRBC: 0 % (ref 0.0–0.2)

## 2023-06-12 LAB — LIPASE, BLOOD: Lipase: 24 U/L (ref 11–51)

## 2023-06-12 LAB — PREGNANCY, URINE: Preg Test, Ur: NEGATIVE

## 2023-06-12 MED ORDER — ONDANSETRON HCL 4 MG/2ML IJ SOLN
4.0000 mg | Freq: Once | INTRAMUSCULAR | Status: AC
  Administered 2023-06-12: 4 mg via INTRAVENOUS
  Filled 2023-06-12: qty 2

## 2023-06-12 MED ORDER — LOPERAMIDE HCL 2 MG PO CAPS
4.0000 mg | ORAL_CAPSULE | Freq: Once | ORAL | Status: AC
  Administered 2023-06-12: 4 mg via ORAL
  Filled 2023-06-12: qty 2

## 2023-06-12 MED ORDER — SODIUM CHLORIDE 0.9 % IV BOLUS
1000.0000 mL | Freq: Once | INTRAVENOUS | Status: AC
  Administered 2023-06-12: 1000 mL via INTRAVENOUS

## 2023-06-12 MED ORDER — DIPHENHYDRAMINE HCL 50 MG/ML IJ SOLN
25.0000 mg | Freq: Once | INTRAMUSCULAR | Status: AC
  Administered 2023-06-12: 25 mg via INTRAVENOUS
  Filled 2023-06-12: qty 1

## 2023-06-12 MED ORDER — METOCLOPRAMIDE HCL 5 MG/ML IJ SOLN
10.0000 mg | Freq: Once | INTRAMUSCULAR | Status: AC
  Administered 2023-06-12: 10 mg via INTRAVENOUS
  Filled 2023-06-12: qty 2

## 2023-06-12 MED ORDER — ONDANSETRON 4 MG PO TBDP: ORAL_TABLET | ORAL | 0 refills | Status: DC

## 2023-06-12 NOTE — ED Triage Notes (Signed)
 Pt reports she is here today due to N&V&D with abd pain since Thursday.

## 2023-06-12 NOTE — ED Provider Notes (Signed)
 [place] EMERGENCY DEPARTMENT AT [facility] Provider Note   CSN: [id number] Arrival date & time: 06/12/23  0347     History  Chief Complaint  Patient presents with   Emesis    Brandy Beasley is a 43 y.o. female.  43 yo F with a chief of nausea vomiting and diarrhea.  Going on for about a week.  Denies any sick contacts.  She does work in the hospital system.  No suspicious food intake.  No recent international travel.  Abdominal pain to both the left and right side that seem to come and go.   Emesis      Home Medications Prior to Admission medications   Medication Sig Start Date End Date Taking? Authorizing Provider  ondansetron (ZOFRAN-ODT) 4 MG disintegrating tablet 4mg  ODT q4 hours prn nausea/vomit 06/12/23  Yes Melene Plan, DO  buPROPion [facility] SR) 150 MG 12 hr tablet Take 1 tablet (150 mg total) by mouth 2 (two) times daily. Take 1 tablet daily for first 3 days then increase to 1 tablet twice daily. 04/10/22   Park Meo, FNP  COVID-19 At [facility] Antigen Test KIT Use as directed. 01/02/22   Waldon Merl, PA-C  metFORMIN (GLUCOPHAGE) 500 MG tablet Take 1 tablet (500 mg total) by mouth 2 (two) times daily with a meal. 04/19/22   Park Meo, FNP  metroNIDAZOLE (FLAGYL) 500 MG tablet Take 1 tablet (500 mg total) by mouth 2 (two) times daily. 02/15/23   Melton Alar R, PA-C      Allergies    Patient has no known allergies.    Review of Systems   Review of Systems  Gastrointestinal:  Positive for vomiting.    Physical Exam Updated Vital Signs BP (!) 110/47   Pulse 84   Temp 98.3 F (36.8 C) (Oral)   Resp 18   LMP 05/22/2023   SpO2 97%  Physical Exam Vitals and nursing note reviewed.  Constitutional:      General: She is not in acute distress.    Appearance: She is well-developed. She is not diaphoretic.  HENT:     Head: Normocephalic and atraumatic.  Eyes:     Pupils: Pupils are equal, round, and reactive to light.  Cardiovascular:      Rate and Rhythm: Normal rate and regular rhythm.     Heart sounds: No murmur heard.    No friction rub. No gallop.  Pulmonary:     Effort: Pulmonary effort is normal.     Breath sounds: No wheezing or rales.  Abdominal:     General: There is no distension.     Palpations: Abdomen is soft.     Tenderness: There is no abdominal tenderness.     Comments: Benign abdominal exam  Musculoskeletal:        General: No tenderness.     Cervical back: Normal range of motion and neck supple.  Skin:    General: Skin is warm and dry.  Neurological:     Mental Status: She is alert and oriented to person, place, and time.  Psychiatric:        Behavior: Behavior normal.     ED Results / Procedures / Treatments   Labs (all labs ordered are listed, but only abnormal results are displayed) Labs Reviewed  COMPREHENSIVE METABOLIC PANEL - Abnormal; Notable for the following components:      Result Value   Glucose, Bld 143 (*)    Calcium 8.8 (*)  AST 14 (*)    All other components within normal limits  CBC - Abnormal; Notable for the following components:   WBC 14.9 (*)    All other components within normal limits  URINALYSIS, ROUTINE W REFLEX MICROSCOPIC - Abnormal; Notable for the following components:   Specific Gravity, Urine 1.034 (*)    Hgb urine dipstick SMALL (*)    Protein, ur TRACE (*)    Crystals PRESENT (*)    All other components within normal limits  LIPASE, BLOOD  PREGNANCY, URINE    EKG None  Radiology No results found.  Procedures Procedures    Medications Ordered in ED Medications  ondansetron (ZOFRAN) injection 4 mg (4 mg Intravenous Given 06/12/23 0405)  sodium chloride 0.9 % bolus 1,000 mL (0 mLs Intravenous Stopped 06/12/23 0522)  loperamide (IMODIUM) capsule 4 mg (4 mg Oral Given 06/12/23 0428)  metoCLOPramide (REGLAN) injection 10 mg (10 mg Intravenous Given 06/12/23 0548)  diphenhydrAMINE (BENADRYL) injection 25 mg (25 mg Intravenous Given 06/12/23 0547)     ED Course/ Medical Decision Making/ A&P                                 Medical Decision Making Amount and/or Complexity of Data Reviewed Labs: ordered.  Risk Prescription drug management.   43 yo F with a chief complaints of nausea vomiting and diarrhea.  Going on for about a week.  No known sick contacts.  Will treat supportively here.  Reassess.  LFTs and lipase are unremarkable.  No acute anemia.  Patient feeling better on repeat assessment.  Will discharge home.  PCP follow-up.  6:33 AM:  I have discussed the diagnosis/risks/treatment options with the patient.  Evaluation and diagnostic testing in the emergency department does not suggest an emergent condition requiring admission or immediate intervention beyond what has been performed at this time.  They will follow up with PCP. We also discussed returning to the ED immediately if new or worsening sx occur. We discussed the sx which are most concerning (e.g., sudden worsening pain, fever, inability to tolerate by mouth) that necessitate immediate return. Medications administered to the patient during their visit and any new prescriptions provided to the patient are listed below.  Medications given during this visit Medications  ondansetron (ZOFRAN) injection 4 mg (4 mg Intravenous Given 06/12/23 0405)  sodium chloride 0.9 % bolus 1,000 mL (0 mLs Intravenous Stopped 06/12/23 0522)  loperamide (IMODIUM) capsule 4 mg (4 mg Oral Given 06/12/23 0428)  metoCLOPramide (REGLAN) injection 10 mg (10 mg Intravenous Given 06/12/23 0548)  diphenhydrAMINE (BENADRYL) injection 25 mg (25 mg Intravenous Given 06/12/23 0547)     The patient appears reasonably screen and/or stabilized for discharge and I doubt any other medical condition or other [facility] requiring further screening, evaluation, or treatment in the ED at this time prior to discharge.         Final Clinical Impression(s) / ED Diagnoses Final diagnoses:  Nausea vomiting and diarrhea     Rx / DC Orders ED Discharge Orders          Ordered    ondansetron (ZOFRAN-ODT) 4 MG disintegrating tablet        06/12/23 0629              Melene Plan, DO 06/12/23 [id number]

## 2023-06-12 NOTE — Discharge Instructions (Addendum)
 Follow up with your family doctor in the office.  Please return for worsening abdominal pain or inability eat or drink.    Try to avoid things that may make this worse, most commonly these are spicy foods tomato based products fatty foods chocolate and peppermint.  Alcohol and tobacco can also make this worse.

## 2024-01-29 ENCOUNTER — Ambulatory Visit: Payer: Self-pay

## 2024-01-29 NOTE — Telephone Encounter (Signed)
 Appointment made for 01/30/2024 at 2:30pm with Dr Butler Burr    FYI Only or Action Required?: FYI only for provider.  Patient was last seen in primary care on 04/25/2022 by Kayla Jeoffrey RAMAN, FNP.  Called Nurse Triage reporting Dysuria.  Symptoms began 2 days ago.  Interventions attempted: Nothing.  Symptoms are: gradually worsening.  Triage Disposition: See Physician Within 24 Hours  Patient/caregiver understands and will follow disposition?: Yes               Copied from CRM #[id number]. Topic: Clinical - Red Word Triage >> Jan 29, 2024  8:09 AM Ivette P wrote: Red Word that prompted transfer to Nurse Triage: pt called in for an appt. Stated believes has a UTI, has pain in lower area and has an odor coming from Reason for Disposition  Bad or foul-smelling urine  Answer Assessment - Initial Assessment Questions Patient is advised to call us  back if anything changes or with any further questions/concerns. Patient is advised that if anything worsens to go to the Emergency Room. Patient verbalized understanding.   1. SYMPTOM: What's the main symptom you're concerned about? (e.g., frequency, incontinence)     Odor, some pain with urination 2. ONSET: When did the  symptoms  start?     2 days 3. PAIN: Is there any pain? If Yes, ask: How bad is it? (Scale: 1-10; mild, moderate, severe)     Some discomfort 4. CAUSE: What do you think is causing the symptoms?     Unknown--pt states she has had a UTI and BV in the past 5. OTHER SYMPTOMS: Do you have any other symptoms? (e.g., blood in urine, fever, flank pain, pain with urination)     odor 6. PREGNANCY: Is there any chance you are pregnant? When was your last menstrual period?     No  Protocols used: Urinary Symptoms-A-AH

## 2024-01-30 ENCOUNTER — Encounter: Payer: Self-pay | Admitting: Family Medicine

## 2024-01-30 ENCOUNTER — Ambulatory Visit: Admitting: Family Medicine

## 2024-01-30 VITALS — BP 132/72 | HR 88 | Temp 98.1°F | Ht 65.0 in | Wt 248.5 lb

## 2024-01-30 DIAGNOSIS — R102 Pelvic and perineal pain unspecified side: Secondary | ICD-10-CM | POA: Diagnosis not present

## 2024-01-30 DIAGNOSIS — R350 Frequency of micturition: Secondary | ICD-10-CM

## 2024-01-30 LAB — URINALYSIS, ROUTINE W REFLEX MICROSCOPIC
Bacteria, UA: NONE SEEN /HPF
Bilirubin Urine: NEGATIVE
Glucose, UA: NEGATIVE
Hyaline Cast: NONE SEEN /LPF
Leukocytes,Ua: NEGATIVE
Nitrite: NEGATIVE
Specific Gravity, Urine: 1.025 (ref 1.001–1.035)
WBC, UA: NONE SEEN /HPF (ref 0–5)
pH: 5.5 (ref 5.0–8.0)

## 2024-01-30 LAB — WET PREP FOR TRICH, YEAST, CLUE

## 2024-01-30 LAB — MICROSCOPIC MESSAGE

## 2024-01-30 MED ORDER — METRONIDAZOLE 500 MG PO TABS: 500.0000 mg | ORAL_TABLET | Freq: Three times a day (TID) | ORAL | 0 refills | Status: AC

## 2024-01-30 NOTE — Progress Notes (Signed)
 Subjective:    Patient ID: Brandy Beasley, female    DOB: [birth year], 43 y.o.   MRN: [id number]  Urinary Frequency  Associated symptoms include frequency.   Patient reports increased urinary frequency.  She reports pain if she tries to hold her urine.  This has been going on for few days.  She denies any hematuria.  She denies any back pain.  Urinalysis shows trace ketones, trace blood, negative nitrates, negative leukocyte esterase.  She also reports some vaginal irritation and discomfort.  She denies any vaginal discharge.  She denies any fevers or chills.  We did perform a wet prep today which showed positive clue cells Past Medical History:  Diagnosis Date  . Cellulitis    left lower extremity  . GERD (gastroesophageal reflux disease)   . Headache   . Vaginal delivery 2002, 2006, 2015   Past Surgical History:  Procedure Laterality Date  . CHOLECYSTECTOMY    . LAPAROSCOPIC BILATERAL SALPINGECTOMY Bilateral 12/25/2013   Procedure: LAPAROSCOPIC BILATERAL SALPINGECTOMY;  Surgeon: Olam Mill, MD;  Location: [facility];  Service: Gynecology;  Laterality: Bilateral;  . TUBAL LIGATION  10/2013   Current Outpatient Medications on File Prior to Visit  Medication Sig Dispense Refill  . buPROPion  (WELLBUTRIN  SR) 150 MG 12 hr tablet Take 1 tablet (150 mg total) by mouth 2 (two) times daily. Take 1 tablet daily for first 3 days then increase to 1 tablet twice daily. (Patient not taking: Reported on 01/30/2024) 90 tablet 3  . COVID-19 At Home Antigen Test KIT Use as directed. (Patient not taking: Reported on 01/30/2024) 1 kit 0  . metFORMIN  (GLUCOPHAGE ) 500 MG tablet Take 1 tablet (500 mg total) by mouth 2 (two) times daily with a meal. (Patient not taking: Reported on 01/30/2024) 180 tablet 3  . metroNIDAZOLE  (FLAGYL ) 500 MG tablet Take 1 tablet (500 mg total) by mouth 2 (two) times daily. (Patient not taking: Reported on 01/30/2024) 14 tablet 0  . ondansetron  (ZOFRAN -ODT) 4 MG  disintegrating tablet 4mg  ODT q4 hours prn nausea/vomit (Patient not taking: Reported on 01/30/2024) 20 tablet 0   No current facility-administered medications on file prior to visit.   No Known Allergies Social History   Socioeconomic History  . Marital status: Single    Spouse name: Not on file  . Number of children: Not on file  . Years of education: Not on file  . Highest education level: Not on file  Occupational History  . Not on file  Tobacco Use  . Smoking status: Some Days    Current packs/day: 0.25    Average packs/day: 0.3 packs/day for 17.0 years (4.3 ttl pk-yrs)    Types: Cigarettes  . Smokeless tobacco: Never  . Tobacco comments:    states she is cutting back  Vaping Use  . Vaping status: Never Used  Substance and Sexual Activity  . Alcohol use: No  . Drug use: No    Types: Marijuana    Comment: occasional last use 10-22-13  . Sexual activity: Not Currently  Other Topics Concern  . Not on file  Social History Narrative  . Not on file   Social Drivers of Health   Financial Resource Strain: Not on file  Food Insecurity: Not on file  Transportation Needs: Not on file  Physical Activity: Not on file  Stress: Not on file  Social Connections: Not on file  Intimate Partner Violence: Not on file      Review of Systems  Genitourinary:  Positive  for frequency.  All other systems reviewed and are negative.      Objective:   Physical Exam Vitals reviewed. Exam conducted with a chaperone present.  Constitutional:      Appearance: Normal appearance. She is obese.  Cardiovascular:     Rate and Rhythm: Normal rate and regular rhythm.     Heart sounds: Normal heart sounds. No murmur heard.    No friction rub. No gallop.  Pulmonary:     Effort: Pulmonary effort is normal. No respiratory distress.     Breath sounds: Normal breath sounds. No wheezing, rhonchi or rales.  Abdominal:     General: Abdomen is flat. Bowel sounds are normal. There is no  distension.     Palpations: Abdomen is soft.     Tenderness: There is no abdominal tenderness. There is no guarding.  Genitourinary:    General: Normal vulva.     Pubic Area: No rash.      Vagina: Normal.     Cervix: Discharge present.  Neurological:     Mental Status: She is alert.    Urinalysis does not suggest a urinary tract infection.       Assessment & Plan:   Urinary frequency - Plan: Urinalysis, Routine w reflex microscopic  Pelvic pain - Plan: WET PREP FOR TRICH, YEAST, CLUE, C. trachomatis/N. gonorrhoeae RNA  wet prep shows positive clue cells.  Treat the patient for bacterial vaginosis with metronidazole  500 mg 3 times daily for 7 days.  We discussed vaginal suppositories to help prevent recurrence as she states that this has been frequent in the past.

## 2024-01-31 LAB — C. TRACHOMATIS/N. GONORRHOEAE RNA
C. trachomatis RNA, TMA: NOT DETECTED
N. gonorrhoeae RNA, TMA: NOT DETECTED

## 2024-02-03 ENCOUNTER — Other Ambulatory Visit

## 2024-02-03 ENCOUNTER — Ambulatory Visit: Payer: Self-pay | Admitting: Family Medicine

## 2024-02-03 DIAGNOSIS — Z0000 Encounter for general adult medical examination without abnormal findings: Secondary | ICD-10-CM

## 2024-02-04 ENCOUNTER — Ambulatory Visit: Payer: Self-pay | Admitting: Family Medicine

## 2024-02-05 LAB — CBC WITH DIFFERENTIAL/PLATELET
Absolute Lymphocytes: 3450 {cells}/uL (ref 850–3900)
Absolute Monocytes: 658 {cells}/uL (ref 200–950)
Basophils Absolute: 47 {cells}/uL (ref 0–200)
Basophils Relative: 0.5 %
Eosinophils Absolute: 432 {cells}/uL (ref 15–500)
Eosinophils Relative: 4.6 %
HCT: 42.8 % (ref 35.0–45.0)
Hemoglobin: 13.6 g/dL (ref 11.7–15.5)
MCH: 27.5 pg (ref 27.0–33.0)
MCHC: 31.8 g/dL — ABNORMAL LOW (ref 32.0–36.0)
MCV: 86.5 fL (ref 80.0–100.0)
MPV: 11.8 fL (ref 7.5–12.5)
Monocytes Relative: 7 %
Neutro Abs: 4813 {cells}/uL (ref 1500–7800)
Neutrophils Relative %: 51.2 %
Platelets: 199 Thousand/uL (ref 140–400)
RBC: 4.95 Million/uL (ref 3.80–5.10)
RDW: 14.2 % (ref 11.0–15.0)
Total Lymphocyte: 36.7 %
WBC: 9.4 Thousand/uL (ref 3.8–10.8)

## 2024-02-05 LAB — TEST AUTHORIZATION

## 2024-02-05 LAB — COMPREHENSIVE METABOLIC PANEL WITH GFR
AG Ratio: 1.1 (calc) (ref 1.0–2.5)
ALT: 12 U/L (ref 6–29)
AST: 15 U/L (ref 10–30)
Albumin: 3.9 g/dL (ref 3.6–5.1)
Alkaline phosphatase (APISO): 84 U/L (ref 31–125)
BUN: 10 mg/dL (ref 7–25)
CO2: 28 mmol/L (ref 20–32)
Calcium: 9.2 mg/dL (ref 8.6–10.2)
Chloride: 102 mmol/L (ref 98–110)
Creat: 0.64 mg/dL (ref 0.50–0.99)
Globulin: 3.4 g/dL (ref 1.9–3.7)
Glucose, Bld: 128 mg/dL — ABNORMAL HIGH (ref 65–99)
Potassium: 4.2 mmol/L (ref 3.5–5.3)
Sodium: 137 mmol/L (ref 135–146)
Total Bilirubin: 0.6 mg/dL (ref 0.2–1.2)
Total Protein: 7.3 g/dL (ref 6.1–8.1)
eGFR: 112 mL/min/1.73m2 (ref >=60)

## 2024-02-05 LAB — LIPID PANEL
Cholesterol: 213 mg/dL — ABNORMAL HIGH (ref <200)
HDL: 38 mg/dL — ABNORMAL LOW (ref >=50)
LDL Cholesterol (Calc): 144 mg/dL — ABNORMAL HIGH
Non-HDL Cholesterol (Calc): 175 mg/dL — ABNORMAL HIGH (ref <130)
Total CHOL/HDL Ratio: 5.6 (calc) — ABNORMAL HIGH (ref <5.0)
Triglycerides: 179 mg/dL — ABNORMAL HIGH (ref <150)

## 2024-02-05 LAB — HEMOGLOBIN A1C
Hgb A1c MFr Bld: 6.6 % — ABNORMAL HIGH (ref <5.7)
Mean Plasma Glucose: 143 mg/dL
eAG (mmol/L): 7.9 mmol/L

## 2024-02-05 LAB — TSH: TSH: 2.43 m[IU]/L

## 2024-02-06 ENCOUNTER — Encounter: Admitting: Family Medicine

## 2024-02-27 ENCOUNTER — Ambulatory Visit: Admitting: Family Medicine

## 2024-02-27 ENCOUNTER — Encounter: Payer: Self-pay | Admitting: Family Medicine

## 2024-02-27 DIAGNOSIS — F419 Anxiety disorder, unspecified: Secondary | ICD-10-CM | POA: Diagnosis not present

## 2024-02-27 DIAGNOSIS — Z7985 Long-term (current) use of injectable non-insulin antidiabetic drugs: Secondary | ICD-10-CM

## 2024-02-27 DIAGNOSIS — R87810 Cervical high risk human papillomavirus (HPV) DNA test positive: Secondary | ICD-10-CM

## 2024-02-27 DIAGNOSIS — N898 Other specified noninflammatory disorders of vagina: Secondary | ICD-10-CM | POA: Insufficient documentation

## 2024-02-27 DIAGNOSIS — E782 Mixed hyperlipidemia: Secondary | ICD-10-CM

## 2024-02-27 DIAGNOSIS — R87610 Atypical squamous cells of undetermined significance on cytologic smear of cervix (ASC-US): Secondary | ICD-10-CM

## 2024-02-27 DIAGNOSIS — E119 Type 2 diabetes mellitus without complications: Secondary | ICD-10-CM | POA: Diagnosis not present

## 2024-02-27 DIAGNOSIS — Z0001 Encounter for general adult medical examination with abnormal findings: Secondary | ICD-10-CM

## 2024-02-27 DIAGNOSIS — Z1231 Encounter for screening mammogram for malignant neoplasm of breast: Secondary | ICD-10-CM

## 2024-02-27 LAB — WET PREP FOR TRICH, YEAST, CLUE

## 2024-02-27 MED ORDER — FLUOXETINE HCL 10 MG PO CAPS: 10.0000 mg | ORAL_CAPSULE | Freq: Every day | ORAL | 3 refills | Status: AC

## 2024-02-27 MED ORDER — TIRZEPATIDE 2.5 MG/0.5ML ~~LOC~~ SOAJ: 2.5000 mg | SUBCUTANEOUS | 0 refills | Status: DC

## 2024-02-27 MED ORDER — TIRZEPATIDE 7.5 MG/0.5ML ~~LOC~~ SOAJ: 7.5000 mg | SUBCUTANEOUS | 0 refills | Status: DC

## 2024-02-27 MED ORDER — FREESTYLE LIBRE 3 PLUS SENSOR MISC: 3 refills | Status: DC

## 2024-02-27 MED ORDER — NICOTINE 21 MG/24HR TD PT24: 21.0000 mg | MEDICATED_PATCH | Freq: Every day | TRANSDERMAL | 0 refills | Status: DC

## 2024-02-27 MED ORDER — ROSUVASTATIN CALCIUM 10 MG PO TABS: 10.0000 mg | ORAL_TABLET | Freq: Every day | ORAL | 3 refills | Status: DC

## 2024-02-27 MED ORDER — TIRZEPATIDE 5 MG/0.5ML ~~LOC~~ SOAJ: 5.0000 mg | SUBCUTANEOUS | 0 refills | Status: DC

## 2024-02-27 NOTE — Progress Notes (Signed)
 Complete physical exam  Patient: Brandy Beasley    DOB: 09-07-1980 43 y.o.   MRN: [id number]  Chief Complaint  Patient presents with   Annual Exam    cpe    Subjective:    Brandy Beasley is a 43 y.o. female who presents today for a complete physical exam. She reports consuming a general diet. The patient does not participate in regular exercise at present. She generally feels well. She reports sleeping poorly. She does have additional problems to discuss today.   PMH includes HLD, prediabetes, elevated BP, obesity.   HLD:  Lipid Panel     Component Value Date/Time   CHOL 213 (H) 02/03/2024 0853   TRIG 179 (H) 02/03/2024 0853   HDL 38 (L) 02/03/2024 0853   CHOLHDL 5.6 (H) 02/03/2024 0853   LDLCALC 144 (H) 02/03/2024 0853  The 10-year ASCVD risk score (Arnett DK, et al., 2019) is: 9.6%  Anxiety:  Prediabetes:  Cigarette nicotine dependence:   Obesity: BMI 41.5  Discussed the use of AI scribe software for clinical note transcription with the patient, who gave verbal consent to proceed.  History of Present Illness Brandy Beasley is a 43 year old female with diabetes and obesity who presents for follow-up on her diabetes management and weight loss.  She has a history of diabetes with a recent A1c of 6.6. She was previously on metformin  but discontinued it due to severe gastrointestinal side effects, specifically diarrhea. She is currently not on any diabetes medication and does not monitor her blood sugar levels at home. She wants to obtain a blood sugar monitor to better manage her condition.  Her BMI is 41. She did not follow through with a dietitian referral due to the high cost of the meal plan. She is interested in weight loss medications but is concerned about potential gastrointestinal side effects.  She has resumed smoking after previously quitting due to the high cost of cigarettes while traveling to [place]  to care for her grandmother. She found  Wellbutrin  effective for smoking cessation in the past and is considering using nicotine patches.  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 She has not been on medication for anxiety but is open to trying it and is interested in seeing a therapist.  She has a history of abnormal Pap smears, with the most recent in January 2024 showing high-risk HPV. She has not yet had a colposcopy. She reports odor without discharge and lost her medication.   Most recent fall risk assessment:    02/27/2024    3:46 PM  Fall Risk   Falls in the past year? 0  Number falls in past yr: 0  Injury with Fall? 0  Risk for fall due to : No Fall Risks  Follow up Falls evaluation completed     Most recent depression screenings:    04/25/2022    8:02 AM 04/17/2022    8:07 AM  PHQ 2/9 Scores  PHQ - 2 Score 1 0  PHQ- 9 Score 5  0      Data saved with a previous flowsheet row definition    Vision:Not within last year  and Dental: No current dental problems Patient Active Problem List   Diagnosis Date Noted   Vaginal odor 02/27/2024   Anxiety 02/27/2024   Diabetes mellitus treated with injections of non-insulin medication (HCC) 02/27/2024  Morbid obesity (HCC) 02/27/2024   Mixed hyperlipidemia 04/25/2022   Elevated blood pressure reading in office without diagnosis of hypertension 04/25/2022   Elevated hemoglobin A1c 04/25/2022   ASCUS with positive high risk HPV cervical 04/25/2022   Morbid obesity with BMI of 40.0-44.9, adult (HCC) 04/25/2022   Physical exam, annual 04/10/2022   Tobacco use disorder 04/10/2022   Fatigue 04/10/2022   Normocytic anemia 01/10/2017   Headache 08/27/2013   GERD without esophagitis 02/12/2013   Past Medical History:  Diagnosis Date   Cellulitis    left lower extremity   GERD (gastroesophageal reflux disease)    Headache     Vaginal delivery 2002, 2006, 2015   Past Surgical History:  Procedure Laterality Date   CHOLECYSTECTOMY     LAPAROSCOPIC BILATERAL SALPINGECTOMY Bilateral 12/25/2013   Procedure: LAPAROSCOPIC BILATERAL SALPINGECTOMY;  Surgeon: Olam Mill, MD;  Location: [facility];  Service: Gynecology;  Laterality: Bilateral;   TUBAL LIGATION  10/2013   Social History   Tobacco Use   Smoking status: Some Days    Current packs/day: 0.25    Average packs/day: 0.3 packs/day for 17.0 years (4.3 ttl pk-yrs)    Types: Cigarettes   Smokeless tobacco: Never   Tobacco comments:    states she is cutting back  Vaping Use   Vaping status: Never Used  Substance Use Topics   Alcohol use: No   Drug use: No    Types: Marijuana    Comment: occasional last use 10-22-13   Family History  Problem Relation Age of Onset   Hypertension Mother    HIV Father    Diabetes Maternal Grandmother    Hypertension Maternal Grandmother    Cancer Maternal Grandfather    Hypertension Other    Diabetes Other    Cancer Other    No Known Allergies    Patient Care Team: Kayla Jeoffrey RAMAN, FNP as PCP - General (Family Medicine)   Review of Systems  All other systems reviewed and are negative.     Objective:    BP 124/87   Pulse 98   Temp 98 F (36.7 C)   Ht 5' 5 (1.651 m)   Wt 249 lb 6.4 oz (113.1 kg)   LMP 01/08/2024 (Exact Date)   SpO2 97%   BMI 41.50 kg/m  BP Readings from Last 3 Encounters:  02/27/24 124/87  01/30/24 132/72  04/25/22 (!) 142/88   Wt Readings from Last 3 Encounters:  02/27/24 249 lb 6.4 oz (113.1 kg)  01/30/24 248 lb 8 oz (112.7 kg)  04/25/22 256 lb (116.1 kg)      Physical Exam Vitals and nursing note reviewed.  Constitutional:      Appearance: Normal appearance. She is obese.  HENT:     Head: Normocephalic and atraumatic.     Right Ear: Tympanic membrane, ear canal and external ear normal.     Left Ear: Tympanic membrane, ear canal and external ear normal.     Nose:  Nose normal.     Mouth/Throat:     Mouth: Mucous membranes are moist.     Pharynx: Oropharynx is clear.  Eyes:     Extraocular Movements: Extraocular movements intact.     Conjunctiva/sclera: Conjunctivae normal.     Pupils: Pupils are equal, round, and reactive to light.  Cardiovascular:     Rate and Rhythm: Normal rate and regular rhythm.     Pulses: Normal pulses.     Heart sounds: Normal heart sounds.  Pulmonary:  Effort: Pulmonary effort is normal.     Breath sounds: Normal breath sounds.  Abdominal:     General: Bowel sounds are normal.     Palpations: Abdomen is soft.  Musculoskeletal:        General: Normal range of motion.     Cervical back: Normal range of motion and neck supple.  Skin:    General: Skin is warm and dry.     Capillary Refill: Capillary refill takes less than 2 seconds.  Neurological:     General: No focal deficit present.     Mental Status: She is alert and oriented to person, place, and time. Mental status is at baseline.  Psychiatric:        Mood and Affect: Mood normal.        Behavior: Behavior normal.        Thought Content: Thought content normal.        Judgment: Judgment normal.       No results found for any visits on 02/27/24. Last CBC Lab Results  Component Value Date   WBC 9.4 02/03/2024   HGB 13.6 02/03/2024   HCT 42.8 02/03/2024   MCV 86.5 02/03/2024   MCH 27.5 02/03/2024   RDW 14.2 02/03/2024   PLT 199 02/03/2024   Last metabolic panel Lab Results  Component Value Date   GLUCOSE 128 (H) 02/03/2024   NA 137 02/03/2024   K 4.2 02/03/2024   CL 102 02/03/2024   CO2 28 02/03/2024   BUN 10 02/03/2024   CREATININE 0.64 02/03/2024   EGFR 112 02/03/2024   CALCIUM 9.2 02/03/2024   PHOS 3.9 01/10/2017   PROT 7.3 02/03/2024   ALBUMIN 4.4 06/12/2023   BILITOT 0.6 02/03/2024   ALKPHOS 72 06/12/2023   AST 15 02/03/2024   ALT 12 02/03/2024   ANIONGAP 9 06/12/2023   Last lipids Lab Results  Component Value Date   CHOL  213 (H) 02/03/2024   HDL 38 (L) 02/03/2024   LDLCALC 144 (H) 02/03/2024   TRIG 179 (H) 02/03/2024   CHOLHDL 5.6 (H) 02/03/2024   Last hemoglobin A1c Lab Results  Component Value Date   HGBA1C 6.6 (H) 02/03/2024   Last thyroid functions Lab Results  Component Value Date   TSH 2.43 02/03/2024   Last vitamin D  Lab Results  Component Value Date   VD25OH 14 (L) 03/26/2013   Last vitamin B12 and Folate No results found for: VITAMINB12, FOLATE       Assessment & Plan:    Routine Health Maintenance and Physical Exam Immunization History  Administered Date(s) Administered   Fluad Quad(high Dose 65+) 01/23/2022   Influenza-Unspecified 01/21/2024   Pneumococcal Polysaccharide-23 10/21/2013   Tdap 10/20/2013    Health Maintenance  Topic Date Due   FOOT EXAM  Never done   OPHTHALMOLOGY EXAM  Never done   Diabetic kidney evaluation - Urine ACR  Never done   COVID-19 Vaccine (1 - 2025-26 season) 03/14/2024 (Originally 12/09/2023)   DTaP/Tdap/Td (2 - Td or Tdap) 01/29/2025 (Originally 10/21/2023)   Pneumococcal Vaccine (2 of 2 - PCV) 01/29/2025 (Originally 10/22/2014)   Hepatitis B Vaccines 19-59 Average Risk (1 of 3 - 19+ 3-dose series) 01/29/2025 (Originally 11/07/1999)   Mammogram  01/29/2025 (Originally 11/07/1998)   HPV VACCINES (1 - 3-dose SCDM series) 01/29/2025 (Originally 11/07/2007)   Hepatitis C Screening  01/29/2025 (Originally 11/07/1998)   HEMOGLOBIN A1C  08/03/2024   Diabetic kidney evaluation - eGFR measurement  02/02/2025   Cervical Cancer Screening (HPV/Pap  Cotest)  04/18/2027   Influenza Vaccine  Completed   HIV Screening  Completed   Meningococcal B Vaccine  Aged Out    Discussed health benefits of physical activity, and encouraged her to engage in regular exercise appropriate for her age and condition.  Problem List Items Addressed This Visit     Mixed hyperlipidemia   Relevant Medications   rosuvastatin (CRESTOR) 10 MG tablet   ASCUS with positive  high risk HPV cervical   Relevant Orders   Ambulatory referral to Obstetrics / Gynecology   Vaginal odor   Relevant Orders   WET PREP FOR TRICH, YEAST, CLUE   Anxiety   Relevant Medications   FLUoxetine (PROZAC) 10 MG capsule   Other Relevant Orders   Ambulatory referral to Psychology   Diabetes mellitus treated with injections of non-insulin medication (HCC)   Relevant Medications   tirzepatide (MOUNJARO) 2.5 MG/0.5ML Pen   tirzepatide CLOYDE) 5 MG/0.5ML Pen (Start on 03/26/2024)   rosuvastatin (CRESTOR) 10 MG tablet   tirzepatide (MOUNJARO) 7.5 MG/0.5ML Pen (Start on 04/24/2024)   Other Relevant Orders   Microalbumin / creatinine urine ratio   Amb Referral to Nutrition and Diabetic Education   Morbid obesity (HCC) - Primary   Relevant Medications   tirzepatide (MOUNJARO) 2.5 MG/0.5ML Pen   tirzepatide CLOYDE) 5 MG/0.5ML Pen (Start on 03/26/2024)   tirzepatide (MOUNJARO) 7.5 MG/0.5ML Pen (Start on 04/24/2024)   Other Visit Diagnoses       Encounter for screening mammogram for malignant neoplasm of breast       Relevant Orders   MM 3D SCREENING MAMMOGRAM BILATERAL BREAST       Assessment and Plan Assessment & Plan 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 BMI 41 indicates severe obesity. High cholesterol levels. Mounjaro preferred for weight loss and insulin processing. Statin therapy recommended. - Started Mounjaro 2.5 mg weekly, increase to 5 mg if tolerated, then 7.5 mg. - Educated on heart-healthy diet and protein intake with Mounjaro. - Recommended 30 minutes exercise 5 days/week. - Prescribed low-dose statin. - Provided CGM and prescribed, referred to diabetes educator.  Tobacco use disorder Resumed smoking. Wellbutrin  effective previously. Nicotine patches preferred - Initiated nicotine patches for cessation.  Anxiety disorder Anxiety affects daily functioning. Prozac chosen for favorable side effect  profile. - Started low-dose Prozac. - Referred to therapist.  Other specified noninflammatory disorder of vagina Persistent odor, no discharge, pain, or itching.  Atypical squamous cells of undetermined significance (ASC-US ) on Pap smear, high risk HPV positive High risk HPV positive with ASC-US . High risk for cervical cancer. - Referred to OB GYN for colposcopy and biopsy.  Encounter for screening mammogram for malignant neoplasm of breast Acknowledges importance of screening despite fear. - Encouraged scheduling a mammogram.    Return in about 4 weeks (around 03/26/2024) for diabetes.    Jeoffrey GORMAN Barrio, FNP [facility]

## 2024-02-28 ENCOUNTER — Other Ambulatory Visit (HOSPITAL_COMMUNITY): Payer: Self-pay

## 2024-02-28 ENCOUNTER — Telehealth: Payer: Self-pay | Admitting: Pharmacy Technician

## 2024-02-28 LAB — MICROALBUMIN / CREATININE URINE RATIO
Creatinine, Urine: 242 mg/dL (ref 20–275)
Microalb Creat Ratio: 29 mg/g{creat} (ref <30)
Microalb, Ur: 7 mg/dL

## 2024-02-28 NOTE — Telephone Encounter (Signed)
 Pharmacy Patient Advocate Encounter   Received notification from CoverMyMeds that prior authorization for Mounjaro  2.5MG /0.5ML auto-injectors is required/requested.   Insurance verification completed.   The patient is insured through [facility] MEDICAID.   Per test claim: PA required; PA started via CoverMyMeds. KEY [id number] . Waiting for clinical questions to populate.

## 2024-02-28 NOTE — Telephone Encounter (Signed)
 Pharmacy Patient Advocate Encounter   Received notification from CoverMyMeds that prior authorization for FreeStyle Libre 3 Plus Sensor is required/requested.   Insurance verification completed.   The patient is insured through [facility] MEDICAID.   Per test claim: Unfortunately, medicaid does not provide coverage for CGM supplies unless patient is insulin dependent or experiencing a number of documented hypoglycemic events

## 2024-03-02 ENCOUNTER — Telehealth: Payer: Self-pay

## 2024-03-02 ENCOUNTER — Other Ambulatory Visit (HOSPITAL_COMMUNITY): Payer: Self-pay

## 2024-03-02 NOTE — Telephone Encounter (Signed)
 Copied from CRM [id number]. Topic: Clinical - Medication Prior Auth >> Feb 28, 2024  2:40 PM Fonda T wrote: Reason for CRM: Pt calling, states pharmacy advised that medication needs a PA.   tirzepatide  (MOUNJARO ) 2.5 MG/0.5ML Pen   Pt is requesting a follow up call at [phone].   [facility] [phone] - [place], [place] - [address] AT [facility] [address] [place] [address] Phone: [phone] Fax: [phone]

## 2024-03-02 NOTE — Telephone Encounter (Signed)
 Good morning, please see Mounjaro  encounter 02/28/24. The request was denied and the reason is listed

## 2024-03-02 NOTE — Telephone Encounter (Signed)
 Pharmacy Patient Advocate Encounter  Received notification from [facility] MEDICAID that Prior Authorization for Mounjaro  2.5MG /0.5ML auto-injectors has been DENIED.  Full denial letter will be uploaded to the media tab. See denial reason below.     PA #/Case ID/Reference #: [id number]

## 2024-03-04 ENCOUNTER — Other Ambulatory Visit (HOSPITAL_COMMUNITY): Payer: Self-pay

## 2024-03-04 ENCOUNTER — Other Ambulatory Visit: Payer: Self-pay | Admitting: Family Medicine

## 2024-03-04 ENCOUNTER — Telehealth: Payer: Self-pay | Admitting: Pharmacy Technician

## 2024-03-04 MED ORDER — OZEMPIC (0.25 OR 0.5 MG/DOSE) 2 MG/1.5ML ~~LOC~~ SOPN: 0.2500 mg | PEN_INJECTOR | SUBCUTANEOUS | 0 refills | Status: AC

## 2024-03-04 NOTE — Telephone Encounter (Signed)
 Pharmacy Patient Advocate Encounter   Received notification from CoverMyMeds that prior authorization for Ozempic  (0.25 or 0.5 MG/DOSE) 2MG /3ML pen-injectors is required/requested.   Insurance verification completed.   The patient is insured through [facility] MEDICAID.   Per test claim: PA required; PA started via CoverMyMeds. KEY [id number] . Waiting for clinical questions to populate.

## 2024-03-04 NOTE — Telephone Encounter (Signed)
 Pharmacy Patient Advocate Encounter  Received notification from [facility] MEDICAID that Prior Authorization for Ozempic  (0.25 or 0.5 MG/DOSE) 2MG /3ML pen-injectors has been APPROVED from 03/04/24 to 03/04/25   PA #/Case ID/Reference #: [id number]  Approval letter has been uploaded to her media tab

## 2024-03-31 ENCOUNTER — Ambulatory Visit
Admission: RE | Admit: 2024-03-31 | Discharge: 2024-03-31 | Disposition: A | Discharge status: Home / Self Care | Source: Ambulatory Visit | Attending: Family Medicine | Admitting: Family Medicine

## 2024-03-31 DIAGNOSIS — Z1231 Encounter for screening mammogram for malignant neoplasm of breast: Secondary | ICD-10-CM

## 2024-04-07 ENCOUNTER — Encounter: Payer: Self-pay | Admitting: Family Medicine

## 2024-04-07 ENCOUNTER — Ambulatory Visit: Admitting: Family Medicine

## 2024-04-07 ENCOUNTER — Other Ambulatory Visit (HOSPITAL_COMMUNITY): Payer: Self-pay

## 2024-04-07 VITALS — BP 122/81 | HR 96 | Ht 65.0 in | Wt 251.8 lb

## 2024-04-07 DIAGNOSIS — K219 Gastro-esophageal reflux disease without esophagitis: Secondary | ICD-10-CM | POA: Diagnosis not present

## 2024-04-07 DIAGNOSIS — Z6841 Body Mass Index (BMI) 40.0 and over, adult: Secondary | ICD-10-CM

## 2024-04-07 DIAGNOSIS — E119 Type 2 diabetes mellitus without complications: Secondary | ICD-10-CM | POA: Diagnosis not present

## 2024-04-07 DIAGNOSIS — E782 Mixed hyperlipidemia: Secondary | ICD-10-CM

## 2024-04-07 DIAGNOSIS — Z7985 Long-term (current) use of injectable non-insulin antidiabetic drugs: Secondary | ICD-10-CM | POA: Diagnosis not present

## 2024-04-07 MED ORDER — FREESTYLE LIBRE 3 PLUS SENSOR MISC: 3 refills | Status: AC

## 2024-04-07 MED ORDER — OMEPRAZOLE 20 MG PO CPDR: 20.0000 mg | DELAYED_RELEASE_CAPSULE | Freq: Every day | ORAL | 3 refills | Status: AC

## 2024-04-07 NOTE — Progress Notes (Signed)
 "  Acute Office Visit  Patient ID: Brandy Beasley, female    DOB: 1981/04/07, 43 y.o.   MRN: [id number]  PCP: Kayla Jeoffrey RAMAN, FNP  Chief Complaint  Patient presents with   Medical Management of Chronic Issues    4 wk f/u for DM -Since switching to ozempic  she is experiencing hunger, nausea, heartburn. Needs refill on freestyle libre Notices that the Nicoderm patch makes her jittery      Subjective:     HPI  Discussed the use of AI scribe software for clinical note transcription with the patient, who gave verbal consent to proceed.  History of Present Illness Brandy Beasley is a 43 year old female with type 2 diabetes who presents with side effects from Ozempic .  She has been experiencing increased hunger, significant nausea, and heartburn since starting Ozempic . The initial dose was 0.25 mg for the first four weeks, increased to 0.5 mg last week. Nausea is described as persistent for 1-3 days, and heartburn is rated 6-7 out of 10. Nausea subsides by Sunday after her Wednesday injection, but she feels extremely hungry by Monday.  Despite these symptoms, she has gained weight. Her blood sugars have been stable, with fasting levels below 120 mg/dL. Her dietary habits have changed; she is consuming more salads and less meat, focusing on eggs for protein. She has reduced carbohydrate intake, switching from sweets to peanut butter crackers. She has an upcoming appointment with a dietitian on January 14th.  She is currently on rosuvastatin  for cholesterol management, started four weeks ago, and is due for a recheck of her cholesterol and liver function. Her last A1c was 6.6% on October 27th, and she plans to have labs rechecked in four weeks.  She has been using Nicoderm patches and reports feeling jittery, which she attributes to overthinking. She requires a refill on her [employer] sensors.   Review of Systems  All other systems reviewed and are negative.   Past Medical History:   Diagnosis Date   Cellulitis    left lower extremity   GERD (gastroesophageal reflux disease)    Headache    Vaginal delivery 2002, 2006, 2015    Past Surgical History:  Procedure Laterality Date   CHOLECYSTECTOMY     LAPAROSCOPIC BILATERAL SALPINGECTOMY Bilateral 12/25/2013   Procedure: LAPAROSCOPIC BILATERAL SALPINGECTOMY;  Surgeon: Olam Mill, MD;  Location: [facility];  Service: Gynecology;  Laterality: Bilateral;   TUBAL LIGATION  10/2013    Outpatient Medications Prior to Visit  Medication Sig Dispense Refill   FLUoxetine  (PROZAC ) 10 MG capsule Take 1 capsule (10 mg total) by mouth daily. 90 capsule 3   nicotine  (NICODERM CQ  - DOSED IN MG/24 HOURS) 21 mg/24hr patch Place 1 patch (21 mg total) onto the skin daily. 28 patch 0   OZEMPIC , 0.25 OR 0.5 MG/DOSE, 2 MG/3ML SOPN Inject 0.25 mg into the skin once a week.     rosuvastatin  (CRESTOR ) 10 MG tablet Take 1 tablet (10 mg total) by mouth daily. 90 tablet 3   Continuous Glucose Sensor (FREESTYLE LIBRE 3 PLUS SENSOR) MISC Change sensor every 15 days. 2 each 3   [START ON 04/24/2024] tirzepatide  (MOUNJARO ) 7.5 MG/0.5ML Pen Inject 7.5 mg into the skin once a week. (Patient not taking: Reported on 04/07/2024) 6 mL 0   No facility-administered medications prior to visit.    Allergies[1]     Objective:    BP 122/81   Pulse 96   Ht 5' 5 (1.651 m)  Wt 251 lb 12.8 oz (114.2 kg)   LMP 03/02/2024 (Approximate)   SpO2 98%   BMI 41.90 kg/m  BP Readings from Last 3 Encounters:  04/07/24 122/81  02/27/24 124/87  01/30/24 132/72   Wt Readings from Last 3 Encounters:  04/07/24 251 lb 12.8 oz (114.2 kg)  02/27/24 249 lb 6.4 oz (113.1 kg)  01/30/24 248 lb 8 oz (112.7 kg)      Physical Exam Vitals and nursing note reviewed.  Constitutional:      Appearance: Normal appearance. She is normal weight.  HENT:     Head: Normocephalic and atraumatic.  Cardiovascular:     Rate and Rhythm: Normal rate and regular rhythm.      Pulses: Normal pulses.     Heart sounds: Normal heart sounds.  Pulmonary:     Effort: Pulmonary effort is normal.     Breath sounds: Normal breath sounds.  Skin:    General: Skin is warm and dry.  Neurological:     General: No focal deficit present.     Mental Status: She is alert and oriented to person, place, and time. Mental status is at baseline.  Psychiatric:        Mood and Affect: Mood normal.        Behavior: Behavior normal.        Thought Content: Thought content normal.        Judgment: Judgment normal.       No results found for any visits on 04/07/24.     Assessment & Plan:   Problem List Items Addressed This Visit       Digestive   Gastroesophageal reflux disease without esophagitis   Relevant Medications   omeprazole  (PRILOSEC) 20 MG capsule     Endocrine   Diabetes mellitus treated with injections of non-insulin medication (HCC) - Primary   Relevant Medications   OZEMPIC , 0.25 OR 0.5 MG/DOSE, 2 MG/3ML SOPN   Other Relevant Orders   Comprehensive metabolic panel with GFR   Lipid panel   Hemoglobin A1c   Microalbumin / creatinine urine ratio     Other   Mixed hyperlipidemia   Relevant Orders   Lipid panel   Morbid obesity (HCC)   Relevant Medications   OZEMPIC , 0.25 OR 0.5 MG/DOSE, 2 MG/3ML SOPN    Assessment and Plan Assessment & Plan Type 2 diabetes mellitus Managed with Ozempic . Reports increased hunger, nausea, and heartburn. Blood sugars well-controlled. A1c 6.6% in November. Insurance requires 90-day trial of Ozempic  before considering Mounjaro . Dietary habits may exacerbate side effects. - Continue Ozempic  0.5 mg weekly for 90 days as per insurance requirements. - Referred to dietitian for dietary counseling. - Start a food diary for one week before dietitian appointment. - Consider changing injection site to thigh to reduce nausea. - Will recheck A1c and cholesterol in four weeks.  Mixed hyperlipidemia Managed with rosuvastatin . No  recent lipid panel results available. - Will recheck cholesterol and liver function tests in four weeks.  Morbid obesity Management complicated by side effects from Ozempic . Reports increased hunger and weight gain. Advised dietary changes to manage side effects. - Encouraged increased protein intake through sources like eggs and yogurt. - Advised reducing carbohydrate intake, particularly from crackers and snacks. - Suggested alternatives like apples with peanut butter or celery with peanut butter.  Gastroesophageal reflux disease and medication-induced nausea Experiencing significant heartburn and nausea, likely exacerbated by Ozempic . Discussed potential benefits of changing injection site and dietary modifications to reduce symptoms. - Prescribed  Prilosec for heartburn. - Advised changing injection site to thigh to potentially reduce nausea.  General health maintenance Routine health maintenance discussed. - Performed foot exam to check for neuropathy and sores. - Provided referral information for OB/GYN for colposcopy follow-up. - Requested urine sample to check for proteinuria.    Meds ordered this encounter  Medications   omeprazole  (PRILOSEC) 20 MG capsule    Sig: Take 1 capsule (20 mg total) by mouth daily.    Dispense:  30 capsule    Refill:  3    Supervising Provider:   DUANNE LOWERS T [[id number]]   Continuous Glucose Sensor (FREESTYLE LIBRE 3 PLUS SENSOR) MISC    Sig: Change sensor every 15 days.    Dispense:  2 each    Refill:  3    Supervising Provider:   DUANNE LOWERS T [[id number]]    Return in about 5 weeks (around 05/12/2024) for follow-up.  Jeoffrey GORMAN Barrio, FNP [facility]       [1] No Known Allergies  "

## 2024-04-08 ENCOUNTER — Other Ambulatory Visit (HOSPITAL_COMMUNITY): Payer: Self-pay

## 2024-04-08 ENCOUNTER — Other Ambulatory Visit: Payer: Self-pay | Admitting: Family Medicine

## 2024-04-08 LAB — MICROALBUMIN / CREATININE URINE RATIO
Creatinine, Urine: 236 mg/dL (ref 20–275)
Microalb Creat Ratio: 20 mg/g{creat}
Microalb, Ur: 4.8 mg/dL

## 2024-04-15 ENCOUNTER — Encounter: Payer: Self-pay | Admitting: Family Medicine

## 2024-04-16 ENCOUNTER — Other Ambulatory Visit: Payer: Self-pay | Admitting: Family Medicine

## 2024-04-16 MED ORDER — OZEMPIC (0.25 OR 0.5 MG/DOSE) 2 MG/3ML ~~LOC~~ SOPN: 0.5000 mg | PEN_INJECTOR | SUBCUTANEOUS | 0 refills | Status: AC

## 2024-04-22 ENCOUNTER — Ambulatory Visit: Admitting: Behavioral Health
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
## 2024-04-22 NOTE — Progress Notes (Unsigned)
 No questionnaires on file.  Start: *** end: ***  Patient is here today ***. Patient would like to learn ***. Patient lives with ***.  *** shopping and cooking. Pt reports eating out *** times weekly.  Pt reports making the following changes including ***.  All Pt's questions were answered during this encounter.    History includes:  *** Medications include:  *** Labs noted:  ***  6.6, ozempic , frestyle 3 plus

## 2024-04-27 ENCOUNTER — Encounter: Admitting: Dietician

## 2024-04-27 DIAGNOSIS — E119 Type 2 diabetes mellitus without complications: Secondary | ICD-10-CM

## 2024-05-07 ENCOUNTER — Other Ambulatory Visit

## 2024-05-07 DIAGNOSIS — E782 Mixed hyperlipidemia: Secondary | ICD-10-CM

## 2024-05-07 DIAGNOSIS — E119 Type 2 diabetes mellitus without complications: Secondary | ICD-10-CM

## 2024-05-08 LAB — COMPREHENSIVE METABOLIC PANEL WITH GFR
AG Ratio: 1.3 (calc) (ref 1.0–2.5)
ALT: 14 U/L (ref 6–29)
AST: 13 U/L (ref 10–30)
Albumin: 4.1 g/dL (ref 3.6–5.1)
Alkaline phosphatase (APISO): 82 U/L (ref 31–125)
BUN: 13 mg/dL (ref 7–25)
CO2: 31 mmol/L (ref 20–32)
Calcium: 9.7 mg/dL (ref 8.6–10.2)
Chloride: 100 mmol/L (ref 98–110)
Creat: 0.64 mg/dL (ref 0.50–0.99)
Globulin: 3.1 g/dL (ref 1.9–3.7)
Glucose, Bld: 176 mg/dL — ABNORMAL HIGH (ref 65–99)
Potassium: 4.2 mmol/L (ref 3.5–5.3)
Sodium: 135 mmol/L (ref 135–146)
Total Bilirubin: 0.3 mg/dL (ref 0.2–1.2)
Total Protein: 7.2 g/dL (ref 6.1–8.1)
eGFR: 112 mL/min/{1.73_m2}

## 2024-05-08 LAB — HEMOGLOBIN A1C
Hgb A1c MFr Bld: 6.6 % — ABNORMAL HIGH
Mean Plasma Glucose: 143 mg/dL
eAG (mmol/L): 7.9 mmol/L

## 2024-05-08 LAB — LIPID PANEL
Cholesterol: 200 mg/dL — ABNORMAL HIGH
HDL: 44 mg/dL — ABNORMAL LOW
LDL Cholesterol (Calc): 127 mg/dL — ABNORMAL HIGH
Non-HDL Cholesterol (Calc): 156 mg/dL — ABNORMAL HIGH
Total CHOL/HDL Ratio: 4.5 (calc)
Triglycerides: 169 mg/dL — ABNORMAL HIGH

## 2024-05-11 ENCOUNTER — Ambulatory Visit: Payer: Self-pay | Admitting: Family Medicine

## 2024-05-11 ENCOUNTER — Telehealth: Payer: Self-pay

## 2024-05-12 ENCOUNTER — Ambulatory Visit: Admitting: Family Medicine

## 2024-05-13 ENCOUNTER — Ambulatory Visit: Admitting: Family Medicine

## 2024-05-13 ENCOUNTER — Encounter: Payer: Self-pay | Admitting: Family Medicine

## 2024-05-13 DIAGNOSIS — E782 Mixed hyperlipidemia: Secondary | ICD-10-CM

## 2024-05-13 DIAGNOSIS — Z6841 Body Mass Index (BMI) 40.0 and over, adult: Secondary | ICD-10-CM

## 2024-05-13 DIAGNOSIS — Z7985 Long-term (current) use of injectable non-insulin antidiabetic drugs: Secondary | ICD-10-CM

## 2024-05-13 DIAGNOSIS — E119 Type 2 diabetes mellitus without complications: Secondary | ICD-10-CM

## 2024-05-13 DIAGNOSIS — K219 Gastro-esophageal reflux disease without esophagitis: Secondary | ICD-10-CM

## 2024-05-13 DIAGNOSIS — F17200 Nicotine dependence, unspecified, uncomplicated: Secondary | ICD-10-CM

## 2024-05-13 MED ORDER — NICOTINE 21 MG/24HR TD PT24: 21.0000 mg | MEDICATED_PATCH | Freq: Every day | TRANSDERMAL | 0 refills | Status: DC

## 2024-05-13 MED ORDER — NICOTINE 14 MG/24HR TD PT24: 14.0000 mg | MEDICATED_PATCH | Freq: Every day | TRANSDERMAL | 0 refills | Status: AC

## 2024-05-13 MED ORDER — TRULICITY 1.5 MG/0.5ML ~~LOC~~ SOAJ: 1.5000 mg | SUBCUTANEOUS | 0 refills | Status: AC

## 2024-05-13 MED ORDER — TRULICITY 3 MG/0.5ML ~~LOC~~ SOAJ: 3.0000 mg | SUBCUTANEOUS | 1 refills | Status: AC

## 2024-05-13 MED ORDER — ROSUVASTATIN CALCIUM 20 MG PO TABS: 20.0000 mg | ORAL_TABLET | Freq: Every day | ORAL | 1 refills | Status: AC

## 2024-05-13 NOTE — Progress Notes (Signed)
 "  Acute Office Visit  Patient ID: Brandy Beasley, female    DOB: 1980/04/12, 44 y.o.   MRN: [id number]  PCP: Kayla Brandy RAMAN, FNP  Chief Complaint  Patient presents with   Follow-up    1 mo not satisfied w/ ozempic  results, wants to discuss changing to trulicity   Smoking cessation is in the works.       Subjective:     HPI  Brandy Beasley is here today for diabetes and hyperlipidemia follow up.   DM2: being managed with Ozempic  0.5mg  weekly, did not tolerate ozempic  due to headaches, nausea, increase in appetite. established with dietician, goal A1c <7.0% Lab Results  Component Value Date   HGBA1C 6.6 (H) 05/07/2024   HGBA1C 6.6 (H) 02/03/2024   HGBA1C 7.0 (H) 04/17/2022   HLD: on Rosuvastatin  10mg  daily, goal LDL <70 Denies chest pain Lipid Panel     Component Value Date/Time   CHOL 200 (H) 05/07/2024 0801   TRIG 169 (H) 05/07/2024 0801   HDL 44 (L) 05/07/2024 0801   CHOLHDL 4.5 05/07/2024 0801   LDLCALC 127 (H) 05/07/2024 0801   GERD: on Prilosec 20mg  daily  Discussed the use of AI scribe software for clinical note transcription with the patient, who gave verbal consent to proceed.  History of Present Illness Brandy Beasley is a 44 year old female with obesity and type 2 diabetes who presents for medication management and follow-up.  She has been managing obesity and type 2 diabetes, previously using Ozempic . After increasing the dose to 0.5 mg, she only had enough for six weeks and did not notice significant changes in weight or appetite. She experienced side effects such as headaches, nausea, and increased hunger. Her appointment with a dietitian was rescheduled to February 24. She is attempting to improve her diet by eating more salads and protein, though she consumes a lot of carbs. She walks 30 minutes daily at work.  She is currently taking Prilosec, which effectively manages her acid reflux symptoms. No chest pain, numbness, tingling, extreme thirst, or frequent  urination. Recent lab work showed a decrease in cholesterol levels. Her A1c is 6.6%.  She has been using nicotine  patches to reduce smoking, decreasing from a pack a day to a pack lasting three to four days. She currently smokes four to five cigarettes a day and is due for a refill of the patches, considering reducing the dose to 14 mg.  She monitors her blood sugar at home, with readings ranging from 110 to 170 mg/dL in the morning. She needs a new monitor as her current one is due for replacement.   Review of Systems  All other systems reviewed and are negative.   Past Medical History:  Diagnosis Date   Cellulitis    left lower extremity   GERD (gastroesophageal reflux disease)    Headache    Vaginal delivery 2002, 2006, 2015    Past Surgical History:  Procedure Laterality Date   CHOLECYSTECTOMY     LAPAROSCOPIC BILATERAL SALPINGECTOMY Bilateral 12/25/2013   Procedure: LAPAROSCOPIC BILATERAL SALPINGECTOMY;  Surgeon: Olam Mill, MD;  Location: [facility];  Service: Gynecology;  Laterality: Bilateral;   TUBAL LIGATION  10/2013    Outpatient Medications Prior to Visit  Medication Sig Dispense Refill   Continuous Glucose Sensor (FREESTYLE LIBRE 3 PLUS SENSOR) MISC Change sensor every 15 days. 2 each 3   FLUoxetine  (PROZAC ) 10 MG capsule Take 1 capsule (10 mg total) by mouth daily. 90 capsule 3  omeprazole  (PRILOSEC) 20 MG capsule Take 1 capsule (20 mg total) by mouth daily. 30 capsule 3   OZEMPIC , 0.25 OR 0.5 MG/DOSE, 2 MG/3ML SOPN Inject 0.5 mg into the skin once a week. 3 mL 0   nicotine  (NICODERM CQ  - DOSED IN MG/24 HOURS) 21 mg/24hr patch Place 1 patch (21 mg total) onto the skin daily. 28 patch 0   rosuvastatin  (CRESTOR ) 10 MG tablet Take 1 tablet (10 mg total) by mouth daily. 90 tablet 3   No facility-administered medications prior to visit.    Allergies[1]     Objective:    BP 136/84   Pulse 87   Temp 97.8 F (36.6 C)   Ht 5' 5 (1.651 m)   Wt 256 lb (116.1  kg)   LMP 04/12/2024 (Exact Date) Comment: 04/12/2024-04/19/2024  SpO2 99%   BMI 42.60 kg/m  BP Readings from Last 3 Encounters:  05/13/24 136/84  04/07/24 122/81  02/27/24 124/87   Wt Readings from Last 3 Encounters:  05/13/24 256 lb (116.1 kg)  04/07/24 251 lb 12.8 oz (114.2 kg)  02/27/24 249 lb 6.4 oz (113.1 kg)      Physical Exam Vitals and nursing note reviewed.  Constitutional:      Appearance: Normal appearance. She is obese.  HENT:     Head: Normocephalic and atraumatic.  Cardiovascular:     Rate and Rhythm: Normal rate and regular rhythm.     Pulses: Normal pulses.     Heart sounds: Normal heart sounds.  Pulmonary:     Effort: Pulmonary effort is normal.     Breath sounds: Normal breath sounds.  Skin:    General: Skin is warm and dry.  Neurological:     General: No focal deficit present.     Mental Status: She is alert and oriented to person, place, and time. Mental status is at baseline.  Psychiatric:        Mood and Affect: Mood normal.        Behavior: Behavior normal.        Thought Content: Thought content normal.        Judgment: Judgment normal.       No results found for any visits on 05/13/24.     Assessment & Plan:   Problem List Items Addressed This Visit       Digestive   Gastroesophageal reflux disease without esophagitis     Endocrine   Diabetes mellitus treated with injections of non-insulin medication (HCC)   Relevant Medications   rosuvastatin  (CRESTOR ) 20 MG tablet   Dulaglutide  (TRULICITY ) 3 MG/0.5ML SOAJ (Start on 06/11/2024)   Dulaglutide  (TRULICITY ) 1.5 MG/0.5ML SOAJ     Other   Tobacco use disorder   Mixed hyperlipidemia   Relevant Medications   rosuvastatin  (CRESTOR ) 20 MG tablet   Morbid obesity (HCC) - Primary   Relevant Medications   Dulaglutide  (TRULICITY ) 3 MG/0.5ML SOAJ (Start on 06/11/2024)   Dulaglutide  (TRULICITY ) 1.5 MG/0.5ML SOAJ    Assessment and Plan Assessment & Plan Morbid obesity Weight gain and  increased appetite. Previous Ozempic  dose subtherapeutic. Dietary changes and exercise initiated.. - Continue dietary modifications and exercise regimen. - Follow up with dietitian and behavioral therapy appointments.  Type 2 diabetes mellitus A1c 6.6, indicating good control. Blood glucose 110-170 mg/dL. Needs new continuous glucose monitor. - Ensured availability of new continuous glucose monitor at pharmacy. - Monitor blood glucose levels regularly. - Initiated Trulicity  1.5 mg for 4 weeks, then increase to 3 mg for 4  weeks  Mixed hyperlipidemia Cholesterol levels decreased slightly but remain high. Current rosuvastatin  dose insufficient. - Increased rosuvastatin  to 20 mg daily.  Gastroesophageal reflux disease Symptoms well-controlled with Prilosec. - Continue Prilosec as prescribed.  Nicotine  dependence Reduced smoking from a pack a day to a pack lasting 3-4 days. Nicotine  patches effective. - Decreased nicotine  patch dose to 14 mg. - Continue using nicotine  patches to aid smoking cessation.    Meds ordered this encounter  Medications   DISCONTD: nicotine  (NICODERM CQ  - DOSED IN MG/24 HOURS) 21 mg/24hr patch    Sig: Place 1 patch (21 mg total) onto the skin daily.    Dispense:  28 patch    Refill:  0   rosuvastatin  (CRESTOR ) 20 MG tablet    Sig: Take 1 tablet (20 mg total) by mouth daily.    Dispense:  90 tablet    Refill:  1    Supervising Provider:   DUANNE LOWERS T [[id number]]   nicotine  (NICODERM CQ  - DOSED IN MG/24 HOURS) 14 mg/24hr patch    Sig: Place 1 patch (14 mg total) onto the skin daily.    Dispense:  42 patch    Refill:  0    Supervising Provider:   DUANNE LOWERS T [[id number]]   Dulaglutide  (TRULICITY ) 3 MG/0.5ML SOAJ    Sig: Inject 3 mg into the skin once a week.    Dispense:  2 mL    Refill:  1    Supervising Provider:   DUANNE LOWERS T [[id number]]   Dulaglutide  (TRULICITY ) 1.5 MG/0.5ML SOAJ    Sig: Inject 1.5 mg into the skin once a week for 28 days.     Dispense:  2 mL    Refill:  0    Supervising Provider:   DUANNE LOWERS T [[id number]]    Return in about 3 months (around 08/10/2024) for chronic follow-up with labs 1 week prior.  Brandy GORMAN Barrio, FNP  [facility]      [1] No Known Allergies  "

## 2024-05-15 ENCOUNTER — Telehealth: Payer: Self-pay | Admitting: Pharmacy Technician

## 2024-05-15 ENCOUNTER — Other Ambulatory Visit (HOSPITAL_COMMUNITY): Payer: Self-pay

## 2024-05-15 NOTE — Telephone Encounter (Signed)
 Pharmacy Patient Advocate Encounter   Received notification from [employer] CMM KEY that prior authorization for Trulicity  1.5MG /0.5ML auto-injectors is required/requested.   Insurance verification completed.   The patient is insured through [facility] MEDICAID.   Per test claim: PA required; PA started via CoverMyMeds. KEY [id number] . Waiting for clinical questions to populate.

## 2024-05-15 NOTE — Telephone Encounter (Signed)
 Pharmacy Patient Advocate Encounter  Received notification from [facility] MEDICAID that Prior Authorization for Trulicity  1.5MG /0.5ML auto-injectors has been APPROVED from 05/15/24 to 05/15/25. Ran test claim, Copay is $4.00. This test claim was processed through [facility]- copay amounts may vary at other pharmacies due to pharmacy/plan contracts, or as the patient moves through the different stages of their insurance plan.   PA #/Case ID/Reference #: [id number]

## 2024-06-01 ENCOUNTER — Ambulatory Visit: Admitting: Family Medicine

## 2024-08-06 ENCOUNTER — Other Ambulatory Visit

## 2024-08-10 ENCOUNTER — Ambulatory Visit: Admitting: Family Medicine
